# Patient Record
Sex: Female | Born: 1975 | Race: White | Hispanic: No | Marital: Married | Smoking: Former smoker
Health system: Southern US, Community
[De-identification: ages and names within clinical notes are randomized; demographics above are authoritative.]

## PROBLEM LIST (undated history)

## (undated) DIAGNOSIS — I10 Essential (primary) hypertension: Secondary | ICD-10-CM

## (undated) DIAGNOSIS — R87619 Unspecified abnormal cytological findings in specimens from cervix uteri: Secondary | ICD-10-CM

## (undated) DIAGNOSIS — R87629 Unspecified abnormal cytological findings in specimens from vagina: Secondary | ICD-10-CM

## (undated) DIAGNOSIS — IMO0002 Reserved for concepts with insufficient information to code with codable children: Secondary | ICD-10-CM

## (undated) HISTORY — PX: LEEP: SHX91

## (undated) HISTORY — DX: Unspecified abnormal cytological findings in specimens from vagina: R87.629

## (undated) HISTORY — DX: Reserved for concepts with insufficient information to code with codable children: IMO0002

## (undated) HISTORY — PX: BREAST BIOPSY: SHX20

## (undated) HISTORY — DX: Essential (primary) hypertension: I10

## (undated) HISTORY — DX: Unspecified abnormal cytological findings in specimens from cervix uteri: R87.619

---

## 2002-03-19 ENCOUNTER — Other Ambulatory Visit: Admission: RE | Admit: 2002-03-19 | Discharge: 2002-03-19 | Payer: Self-pay | Admitting: *Deleted

## 2004-09-08 ENCOUNTER — Emergency Department (HOSPITAL_COMMUNITY): Admission: EM | Admit: 2004-09-08 | Discharge: 2004-09-09 | Payer: Self-pay | Admitting: *Deleted

## 2004-09-09 ENCOUNTER — Ambulatory Visit (HOSPITAL_COMMUNITY): Admission: RE | Admit: 2004-09-09 | Discharge: 2004-09-09 | Payer: Self-pay | Admitting: *Deleted

## 2005-01-27 ENCOUNTER — Inpatient Hospital Stay (HOSPITAL_COMMUNITY): Admission: RE | Admit: 2005-01-27 | Discharge: 2005-01-30 | Payer: Self-pay | Admitting: Obstetrics and Gynecology

## 2006-09-01 IMAGING — US US ABDOMEN COMPLETE
1 series · 14 of 25 positions shown · non-contrast
Comparison: none

CLINICAL DATA: Upper abdominal pain and vomiting.  18 weeks pregnant.
 US ABDOMEN COMPLETE:
 Multiple real-time sector scans were performed.  
 The liver has a normal appearance without focal lesions or biliary ductal dilatation.  The gallbladder is normal without stones, sludge, or wall thickening.  The common duct is normal at 3 mm.  The aorta and IVC are normal.  The spleen and pancreas are normal.  Both kidneys are normal, measuring 10.2 and 10.3 cm in length.  No free fluid.  The fetus is alive.

[Series 1: unknown · 0.33mm/px · 14 of 63 slices shown]
[im 1/63]
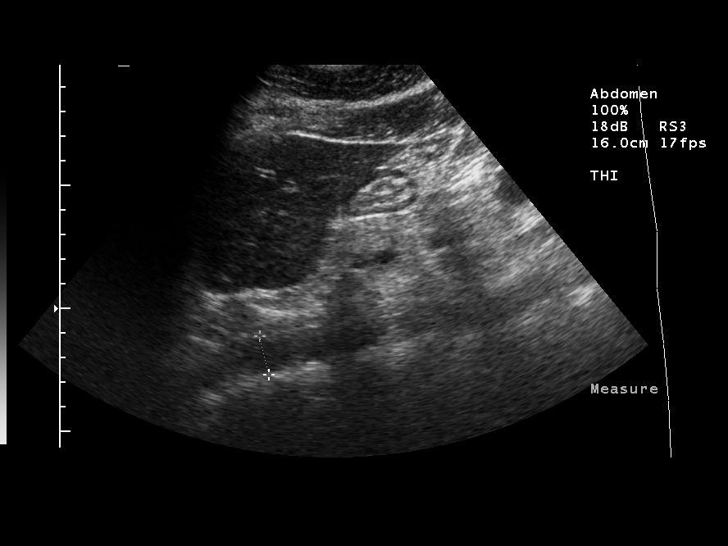
[im 6/63]
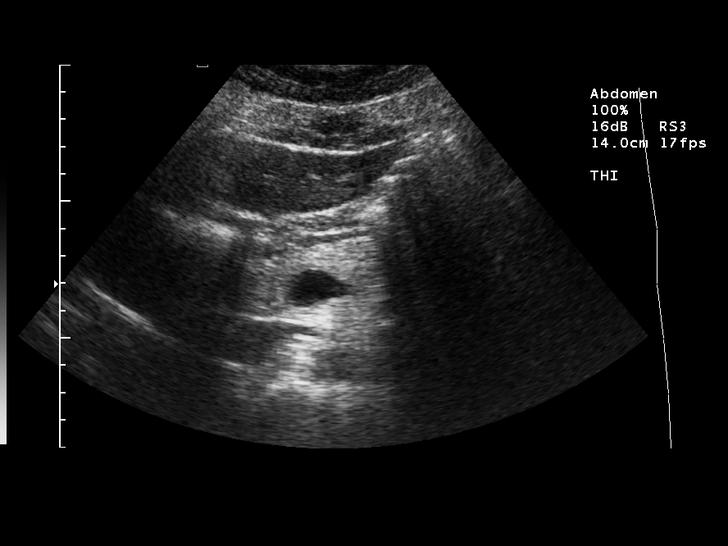
[im 11/63]
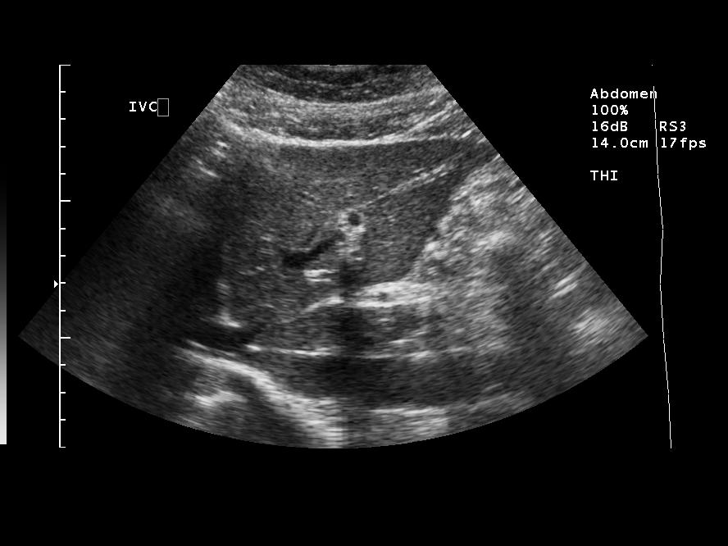
[im 16/63]
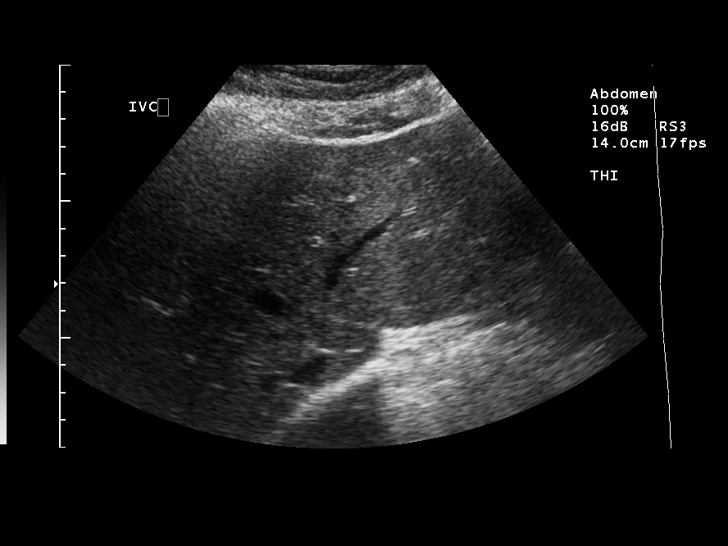
[im 21/63]
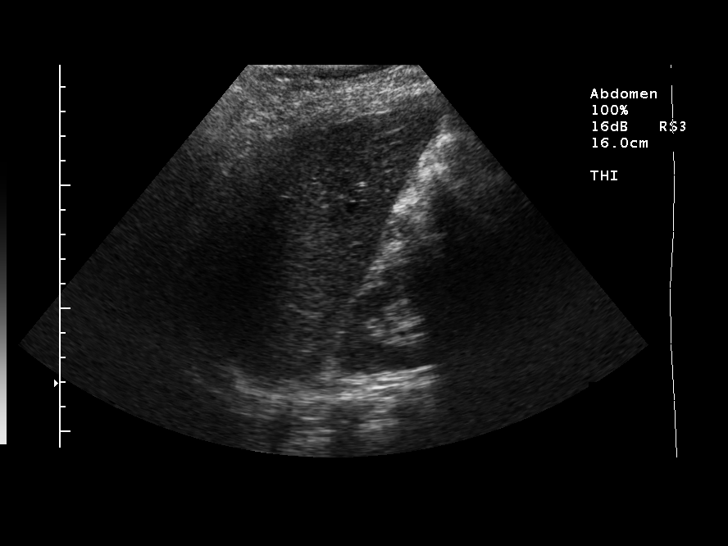
[im 24/63]
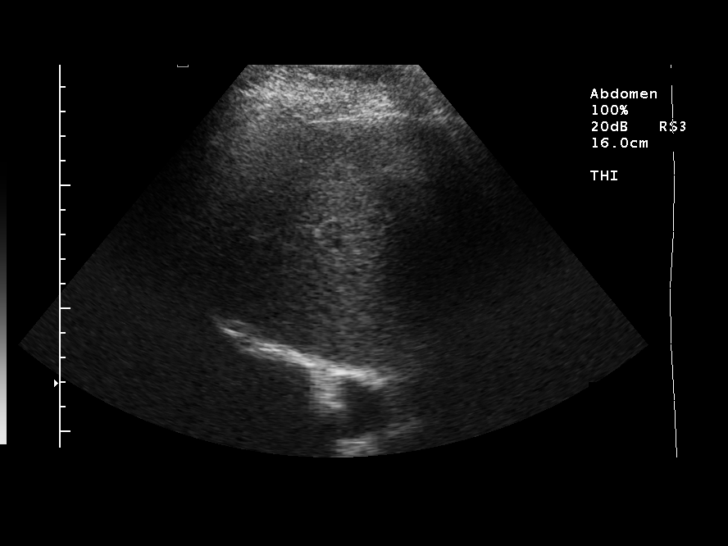
[im 29/63]
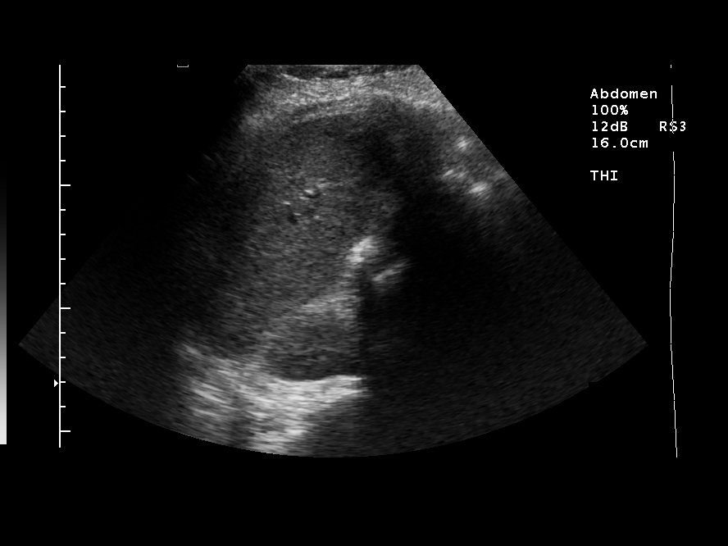
[im 34/63]
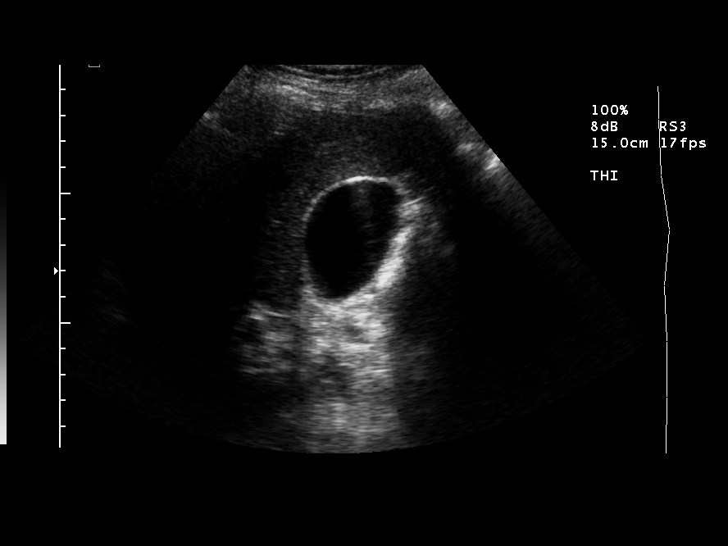
[im 39/63]
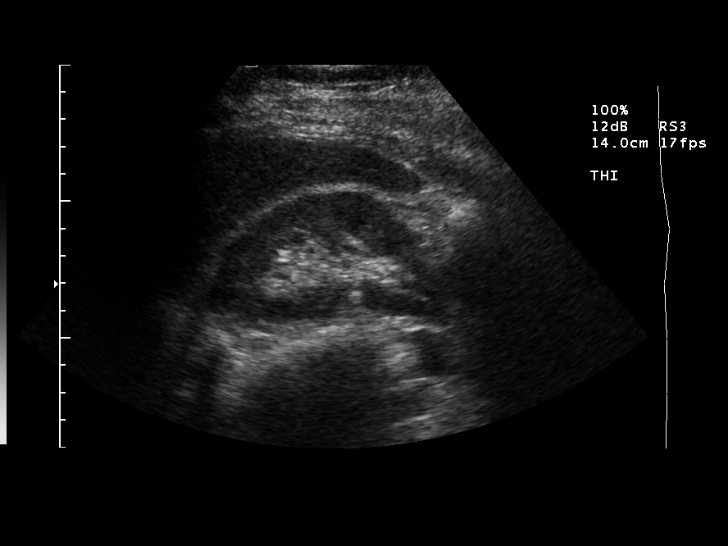
[im 42/63]
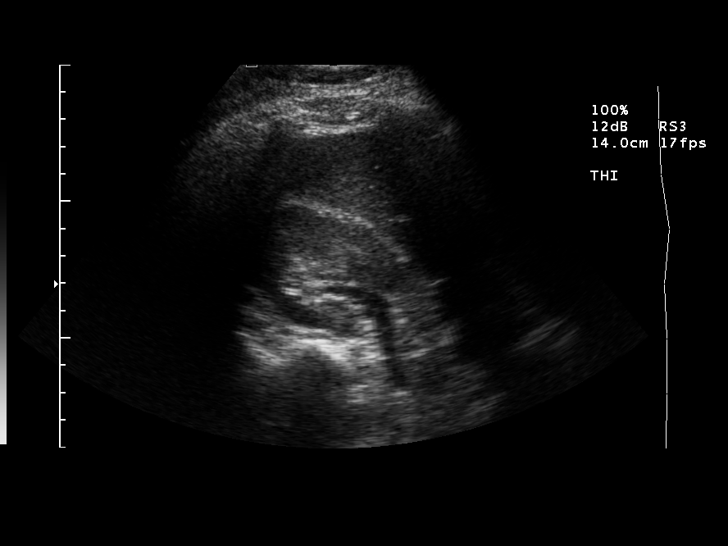
[im 47/63]
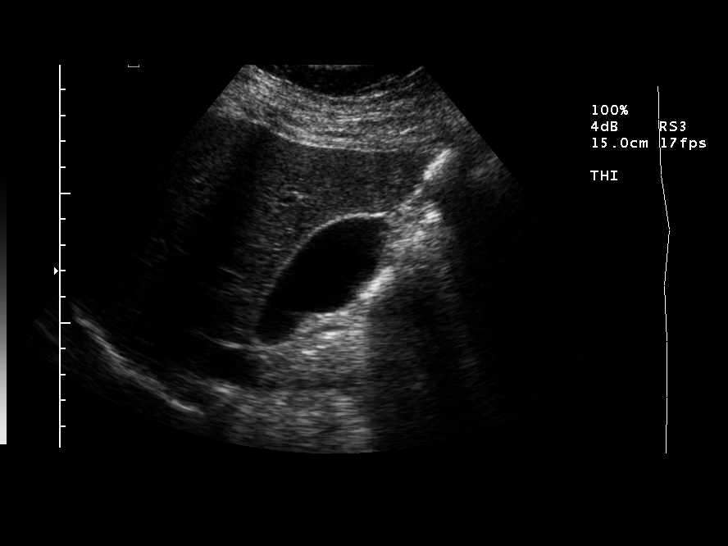
[im 52/63]
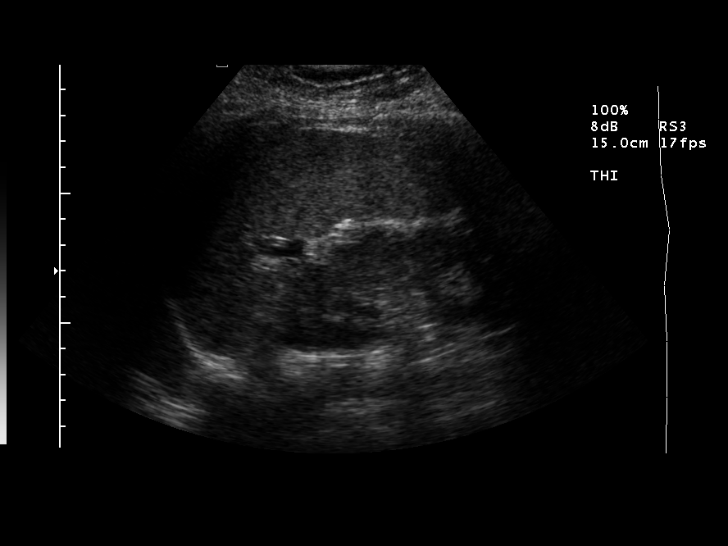
[im 57/63]
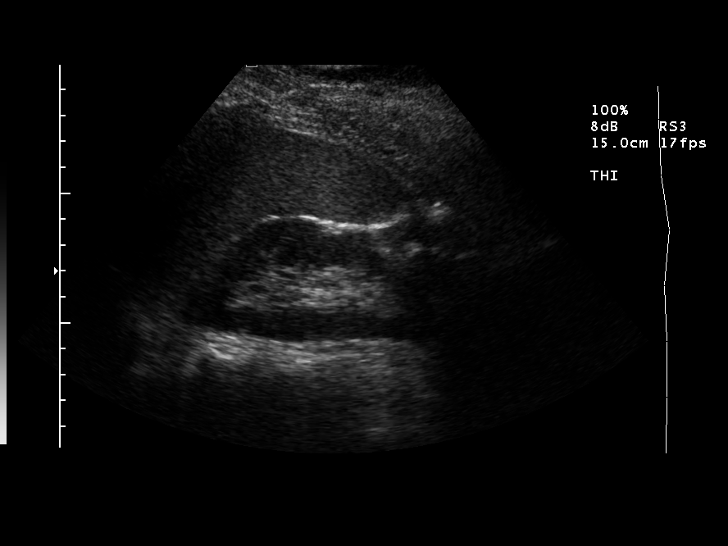
[im 63/63]
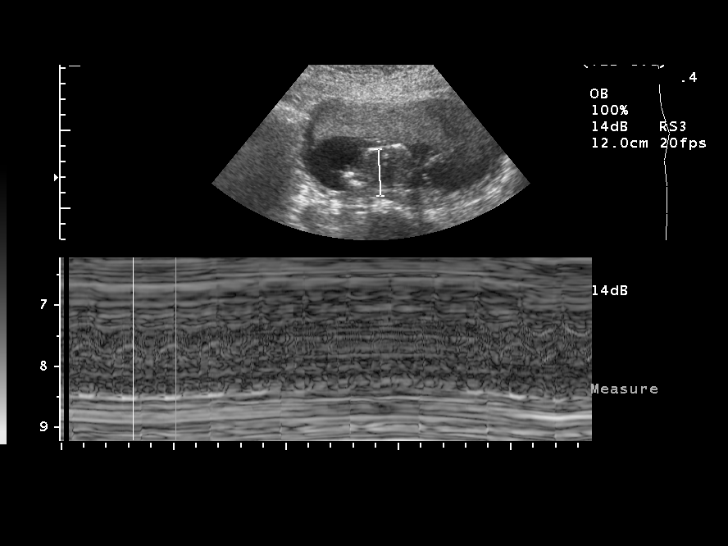

[14 of 25 positions shown; findings below may reference images not displayed]

IMPRESSION: Normal abdominal ultrasound.

## 2007-07-31 ENCOUNTER — Other Ambulatory Visit: Admission: RE | Admit: 2007-07-31 | Discharge: 2007-07-31 | Payer: Self-pay | Admitting: Obstetrics and Gynecology

## 2008-08-05 ENCOUNTER — Other Ambulatory Visit: Admission: RE | Admit: 2008-08-05 | Discharge: 2008-08-05 | Payer: Self-pay | Admitting: Obstetrics and Gynecology

## 2009-10-21 ENCOUNTER — Other Ambulatory Visit: Admission: RE | Admit: 2009-10-21 | Discharge: 2009-10-21 | Payer: Self-pay | Admitting: Obstetrics and Gynecology

## 2010-11-17 ENCOUNTER — Other Ambulatory Visit (HOSPITAL_COMMUNITY)
Admission: RE | Admit: 2010-11-17 | Discharge: 2010-11-17 | Disposition: A | Discharge status: Home / Self Care | Payer: PRIVATE HEALTH INSURANCE | Source: Ambulatory Visit | Attending: Obstetrics and Gynecology | Admitting: Obstetrics and Gynecology

## 2010-11-17 ENCOUNTER — Other Ambulatory Visit: Payer: Self-pay | Admitting: Obstetrics and Gynecology

## 2010-11-17 DIAGNOSIS — Z01419 Encounter for gynecological examination (general) (routine) without abnormal findings: Secondary | ICD-10-CM | POA: Insufficient documentation

## 2010-12-17 NOTE — Op Note (Signed)
NAMEMarland Kitchen  Jasmine Weeks, Jasmine Weeks NO.:  [id number]   MEDICAL RECORD NO.:  [id number]          PATIENT TYPE:  INP   LOCATION:  LDR4                          FACILITY:  [facility]   PHYSICIAN:  Tilda Burrow, M.D. DATE OF BIRTH:  Nov 08, 1975   DATE OF PROCEDURE:  01/27/2005  DATE OF DISCHARGE:                                 OPERATIVE REPORT   DELIVERY NOTE:  [name] was admitted on the morning of January 27, 2005  with the cervix 1, 50% and -3 station with the presenting part quite high.  Membrane rupture was confirmed.  She was begun on Pitocin and had a slow  response.  A 4 P.M. she was only 1 cm, 50% and -3 with a narrow pelvis.  Contractions were mild on oxytocin 20 mg/minute.  Fetal heart rate was  reactive.  She progressed minimally by 7 P.M. and a pressure catheter was  inserted with the cervix 2 cm.  She progressed to a rapid active phase of  labor, delivering at 11:30 P.M., after two contractions in the second stage,  a healthy female infant with Apgars of 8 and 9, weight __________  without  lacerations.  There was a nuchal cord times one.   The amniotic fluid was clear without malodor.   Delivery was attended by  the baby's father.   There was [facility] presentation of the placenta.   ESTIMATED BLOOD LOSS:  Three-hundred-fifty milliliter blood loss.       JVF/MEDQ  D:  01/27/2005  T:  01/28/2005  Job:  [id number]

## 2010-12-17 NOTE — H&P (Signed)
NAME:  Jasmine Weeks, Jasmine Weeks NO.:  [id number]   MEDICAL RECORD NO.:  [id number]          PATIENT TYPE:  INP   LOCATION:  LDR4                          FACILITY:  [facility]   PHYSICIAN:  Tilda Burrow, M.D. DATE OF BIRTH:  30-Oct-1975   DATE OF ADMISSION:  01/27/2005  DATE OF DISCHARGE:  LH                                HISTORY & PHYSICAL   REASON FOR ADMISSION:  Pregnancy at 33 weeks with spontaneous rupture of  membranes at 3:30 a.m.   HISTORY OF PRESENT ILLNESS:  Analysia is a 35 year old, gravida 2, para 1, EDC  July 19 who woke up this morning leaking clear fluid but is now grossly  ruptured.   PAST MEDICAL HISTORY:  Negative.   PAST SURGICAL HISTORY:  Negative.   ALLERGIES:  She is allergic to PENICILLIN.   FAMILY HISTORY:  Positive for diabetes, hypertension, coronary artery  disease and cancer.   SOCIAL HISTORY:  She is married and her husband is here with her.   PRENATAL COURSE:  Essentially uneventful. Blood type is O negative. Rubella  is immune. Hepatitis C surface antigen negative, HIV is negative, HSV 2 is  negative. __________ is nonreactive. Pap is normal. GC and chlamydia are  both negative, AFP normal. A 28 week hemoglobin 11.3, 28 week hematocrit  34.5. One hour glucose was 176. Three hour glucose was as follows, normal.  Sickle cell screen was negative, GBS is positive and she is to be treated  with prophylaxis.   PHYSICAL EXAMINATION:  Stable. Fetal heart rate pattern is stable. She is  leaking a large amount of clear fluid.   PLAN:  Going to admit. Expect vaginal delivery. Start some Pitocin  augmentation.      DL/MEDQ  D:  81/19/1478  T:  01/27/2005  Job:  [id number]

## 2011-12-07 ENCOUNTER — Other Ambulatory Visit (HOSPITAL_COMMUNITY)
Admission: RE | Admit: 2011-12-07 | Discharge: 2011-12-07 | Disposition: A | Discharge status: Home / Self Care | Payer: PRIVATE HEALTH INSURANCE | Source: Ambulatory Visit | Attending: Obstetrics and Gynecology | Admitting: Obstetrics and Gynecology

## 2011-12-07 ENCOUNTER — Other Ambulatory Visit: Payer: Self-pay | Admitting: Obstetrics and Gynecology

## 2011-12-07 DIAGNOSIS — Z01419 Encounter for gynecological examination (general) (routine) without abnormal findings: Secondary | ICD-10-CM | POA: Insufficient documentation

## 2013-01-17 ENCOUNTER — Other Ambulatory Visit: Payer: Self-pay | Admitting: Obstetrics and Gynecology

## 2013-01-21 ENCOUNTER — Encounter: Payer: Self-pay | Admitting: *Deleted

## 2013-01-21 ENCOUNTER — Other Ambulatory Visit: Payer: PRIVATE HEALTH INSURANCE

## 2013-01-21 ENCOUNTER — Encounter: Payer: Self-pay | Admitting: Obstetrics and Gynecology

## 2013-01-21 ENCOUNTER — Ambulatory Visit (INDEPENDENT_AMBULATORY_CARE_PROVIDER_SITE_OTHER): Payer: PRIVATE HEALTH INSURANCE | Admitting: Obstetrics and Gynecology

## 2013-01-21 ENCOUNTER — Other Ambulatory Visit (HOSPITAL_COMMUNITY)
Admission: RE | Admit: 2013-01-21 | Discharge: 2013-01-21 | Disposition: A | Discharge status: Home / Self Care | Payer: PRIVATE HEALTH INSURANCE | Source: Ambulatory Visit | Attending: Obstetrics and Gynecology | Admitting: Obstetrics and Gynecology

## 2013-01-21 VITALS — BP 120/84 | Ht 63.75 in | Wt 175.6 lb

## 2013-01-21 DIAGNOSIS — Z0000 Encounter for general adult medical examination without abnormal findings: Secondary | ICD-10-CM

## 2013-01-21 DIAGNOSIS — Z01419 Encounter for gynecological examination (general) (routine) without abnormal findings: Secondary | ICD-10-CM

## 2013-01-21 DIAGNOSIS — Z1151 Encounter for screening for human papillomavirus (HPV): Secondary | ICD-10-CM | POA: Insufficient documentation

## 2013-01-21 LAB — CBC
HCT: 39.4 % (ref 36.0–46.0)
MCHC: 34.8 g/dL (ref 30.0–36.0)
RDW: 14.1 % (ref 11.5–15.5)

## 2013-01-21 LAB — LIPID PANEL
LDL Cholesterol: 84 mg/dL (ref 0–99)
Triglycerides: 158 mg/dL — ABNORMAL HIGH (ref <150)
VLDL: 32 mg/dL (ref 0–40)

## 2013-01-21 LAB — COMPREHENSIVE METABOLIC PANEL
AST: 13 U/L (ref 0–37)
Albumin: 4.3 g/dL (ref 3.5–5.2)
Alkaline Phosphatase: 40 U/L (ref 39–117)
BUN: 13 mg/dL (ref 6–23)
Potassium: 4 mEq/L (ref 3.5–5.3)
Sodium: 140 mEq/L (ref 135–145)

## 2013-01-21 NOTE — H&P (Addendum)
   [facility] Visit  Patient name: Jasmine Weeks MRN [id number]  Date of birth: 04/20/1976  CC & HPI:  Jasmine Weeks is a 38 y.o. female presenting today for annual exam.  ROS:  No gyn complaints. Desires annual lab work. Is n.p.o. today has no primary care physician that she sees regularly. Sexually active without current contraception none contraception desired Pertinent History Reviewed:  Medical & Surgical Hx:  Reviewed: Significant for negative review of systems Medications: Reviewed & Updated - see associated section Social History: Reviewed -  reports that she has been smoking Cigarettes.  She has been smoking about 0.50 packs per day. She has never used smokeless tobacco.  Objective Findings:  Vitals: BP 120/84  Ht 5' 3.75" (1.619 m)  Wt 175 lb 9.6 oz (79.652 kg)  BMI 30.39 kg/m2  LMP 01/07/2013  Physical Examination: General appearance - alert, well appearing, and in no distress, oriented to person, place, and time and normal appearing weight Mental status - alert, oriented to person, place, and time, normal mood, behavior, speech, dress, motor activity, and thought processes Eyes - pupils equal and reactive, extraocular eye movements intact Lymphatics -  Heart - normal rate and regular rhythm Abdomen - soft, nontender, nondistended, no masses or organomegaly Breasts - breasts appear normal, no suspicious masses, no skin or nipple changes or axillary nodes Pelvic - normal external genitalia, vulva, vagina, cervix, uterus and adnexa. Patient status post LEEP but cervix appears grossly normal Extremities - peripheral pulses normal, no pedal edema, no clubbing or cyanosis, Homan's sign negative bilaterally Skin - normal coloration and turgor, no rashes, no suspicious skin lesions noted    Assessment & Plan:   Normal annual GYN exam Plan: Screening laboratory work including CBC, CMP, thyroid function tests, lipids     Prenatal vitamins encouraged Seen by Dr.  Margot Ables under my direct supervision and results confirmed

## 2013-01-21 NOTE — Progress Notes (Signed)
Tilda Burrow, MD Physician Addendum  H&P Service date: 01/21/2013 10:32 AM   [facility] Visit   Patient name: Jasmine Weeks MRN [id number] Date of birth: [birth year]   CC & HPI:   Jasmine Weeks is a 37 y.o. female presenting today for annual exam.   ROS:   No gyn complaints. Desires annual lab work. Is n.p.o. today has no primary care physician that she sees regularly.  Sexually active without current contraception none contraception desired   Pertinent History Reviewed:   Medical & Surgical Hx: Reviewed: Significant for negative review of systems  Medications: Reviewed & Updated - see associated section  Social History: Reviewed - reports that she has been smoking Cigarettes. She has been smoking about 0.50 packs per day. She has never used smokeless tobacco.   Objective Findings:   Vitals: BP 120/84  Ht 5' 3.75" (1.619 m)  Wt 175 lb 9.6 oz (79.652 kg)  BMI 30.39 kg/m2  LMP 01/07/2013  Physical Examination: General appearance - alert, well appearing, and in no distress, oriented to person, place, and time and normal appearing weight  Mental status - alert, oriented to person, place, and time, normal mood, behavior, speech, dress, motor activity, and thought processes  Eyes - pupils equal and reactive, extraocular eye movements intact  Lymphatics -  Heart - normal rate and regular rhythm  Abdomen - soft, nontender, nondistended, no masses or organomegaly  Breasts - breasts appear normal, no suspicious masses, no skin or nipple changes or axillary nodes  Pelvic - normal external genitalia, vulva, vagina, cervix, uterus and adnexa. Patient status post LEEP but cervix appears grossly normal  Extremities - peripheral pulses normal, no pedal edema, no clubbing or cyanosis, Homan's sign negative bilaterally  Skin - normal coloration and turgor, no rashes, no suspicious skin lesions noted   Assessment & Plan:   Normal annual GYN exam  Plan: Screening laboratory work including  CBC, CMP, thyroid function tests, lipids  Prenatal vitamins encouraged  Seen by Dr. Margot Ables under my direct supervision and results confirmed

## 2013-01-21 NOTE — Patient Instructions (Addendum)
Thank you for coming in today Overall you are doing great. We will let you know if any of your labs come back abnormal Please start taking a prenatal vitamin if you anticipate getting pregnant Please come back in 1 year for your next physical or sooner if needed Have a great day.  Pap Test A Pap test is a procedure done in a clinic office to evaluate cells that are on the surface of the cervix. The cervix is the lower portion of the uterus and upper portion of the vagina. For some women, the cervical region has the potential to form cancer. With consistent evaluations by your caregiver, this type of cancer can be prevented.  If a Pap test is abnormal, it is most often a result of a previous exposure to human papillomavirus (HPV). HPV is a virus that can infect the cells of the cervix and cause dysplasia. Dysplasia is where the cells no longer look normal. If a woman has been diagnosed with high-grade or severe dysplasia, they are at higher risk of developing cervical cancer. People diagnosed with low-grade dysplasia should still be seen by their caregiver because there is a small chance that low-grade dysplasia could develop into cancer.  LET YOUR CAREGIVER KNOW ABOUT:  Recent sexually transmitted infection (STI) you have had.  Any new sex partners you have had.  History of previous abnormal Pap tests results.  History of previous cervical procedures you have had (colposcopy, biopsy, loop electrosurgical excision procedure [LEEP]).  Concerns you have had regarding unusual vaginal discharge.  History of pelvic pain.  Your use of birth control. BEFORE THE PROCEDURE  Ask your caregiver when to schedule your Pap test. It is best not to be on your period if your caregiver uses a wooden spatula to collect cells or applies cells to a glass slide. Newer techniques are not so sensitive to the timing of a menstrual cycle.  Do not douche or have sexual intercourse for 24 hours before the test.    Do not use vaginal creams or tampons for 24 hours before the test.   Empty your bladder just before the test to lessen any discomfort.  PROCEDURE You will lie on an exam table with your feet in stirrups. A warm metal or plastic instrument (speculum) is placed in your vagina. This instrument allows your caregiver to see the inside of your vagina and look at your cervix. A small, plastic brush or wooden spatula is then used to collect cervical cells. These cells are placed in a lab specimen container. The cells are looked at under a microscope. A specialist will determine if the cells are normal.  AFTER THE PROCEDURE Make sure to get your test results.If your results come back abnormal, you may need further testing.  Document Released: 10/08/2002 Document Revised: 10/10/2011 Document Reviewed: 07/14/2011 [facility] Patient Information 2014 [place], [place].

## 2013-01-27 ENCOUNTER — Telehealth: Payer: Self-pay | Admitting: Obstetrics and Gynecology

## 2013-01-27 NOTE — Telephone Encounter (Signed)
Patient made aware of normal saline lab results including Pap smear thyroid function tests and lab work

## 2014-02-26 ENCOUNTER — Encounter: Payer: Self-pay | Admitting: Obstetrics and Gynecology

## 2014-02-26 ENCOUNTER — Other Ambulatory Visit (HOSPITAL_COMMUNITY)
Admission: RE | Admit: 2014-02-26 | Discharge: 2014-02-26 | Disposition: A | Discharge status: Home / Self Care | Payer: PRIVATE HEALTH INSURANCE | Source: Ambulatory Visit | Attending: Obstetrics and Gynecology | Admitting: Obstetrics and Gynecology

## 2014-02-26 ENCOUNTER — Ambulatory Visit (INDEPENDENT_AMBULATORY_CARE_PROVIDER_SITE_OTHER): Payer: PRIVATE HEALTH INSURANCE | Admitting: Obstetrics and Gynecology

## 2014-02-26 VITALS — BP 124/80 | Ht 63.0 in | Wt 163.0 lb

## 2014-02-26 DIAGNOSIS — R87619 Unspecified abnormal cytological findings in specimens from cervix uteri: Secondary | ICD-10-CM

## 2014-02-26 DIAGNOSIS — Z01419 Encounter for gynecological examination (general) (routine) without abnormal findings: Secondary | ICD-10-CM

## 2014-02-26 DIAGNOSIS — Z1151 Encounter for screening for human papillomavirus (HPV): Secondary | ICD-10-CM | POA: Insufficient documentation

## 2014-02-26 NOTE — Progress Notes (Signed)
Patient ID: Jasmine FormLori Weeks, female   DOB: 09/14/1975, 38 y.o.   MRN: [id number] This chart was scribed by Leone PayorSonum Patel, Medical Scribe, for Dr. Christin BachJohn Earsie Humm on 02/26/14 at 11:17 AM. This chart was reviewed by Dr. Christin BachJohn Jonah Nestle for accuracy.   Subjective:  Jasmine Weeks is a 38 y.o. female G2P2 who presents for annual exam. Patient's last menstrual period was 01/23/2014. Pt here today for annual exam. Pt states that she has few moles/skin tags she wants Dr. Emelda FearFerguson to look at today as well. Pt denies any other problems or concerns at this time.   The following portions of the patient's history were reviewed and updated as appropriate: allergies, current medications, past family history, past medical history, past social history, past surgical history and problem list. Past Medical History  Diagnosis Date  . Abnormal Pap smear     Past Surgical History  Procedure Laterality Date  . Leep      No current outpatient prescriptions on file.  Review of Systems Constitutional: negative Gastrointestinal: negative Genitourinary: negative  Objective:  BP 124/80  Ht 5\' 3"  (1.6 m)  Wt 163 lb (73.936 kg)  BMI 28.88 kg/m2  LMP 01/23/2014   BMI: Body mass index is 28.88 kg/(m^2).  General Appearance: Alert, appropriate appearance for age. No acute distress HEENT: Grossly normal benign appearing smooth 3 mm mole with hair x 1 on right jaw line,  Neck / Thyroid:  Cardiovascular: RRR; normal S1, S2, no murmur Lungs: CTA bilaterally   Skin tag on right chest wall 3mm x 6 mm long, will excise herself Back: No CVAT Breast Exam: No dimpling, nipple retraction or discharge. No masses or nodes., Normal to inspection, Normal breast tissue bilaterally and No masses or nodes.No dimpling, nipple retraction or discharge. Gastrointestinal: Soft, non-tender, no masses or organomegaly Pelvic Exam: External genitalia: normal general appearance Vaginal: normal mucosa without prolapse or lesions, normal without  tenderness, induration or masses and normal rugae Cervix: normal appearance Adnexa: normal bimanual exam Uterus: normal single, nontender Rectovaginal: not indicated Lymphatic Exam: Non-palpable nodes in neck, clavicular, axillary, or inguinal regions  Skin: no rash or abnormalities Neurologic: Normal gait and speech, no tremor  Psychiatric: Alert and oriented, appropriate affect.  Urinalysis:Not done  Assessment:  Annual Gyn Exam Benign appearing skin tag chest wall Hx LEEP for abnormal pap   Plan:  1. pap smear done, next pap due 1 year annual x 20 yr due to LEEp 2. return annually or prn 3    mammogram advised at age 38  Christin BachJohn Yachet Mattson. MD Pgr [phone] 11:15 AM

## 2014-02-27 LAB — CYTOLOGY - PAP

## 2014-06-02 ENCOUNTER — Encounter: Payer: Self-pay | Admitting: Obstetrics and Gynecology

## 2015-05-11 ENCOUNTER — Ambulatory Visit (INDEPENDENT_AMBULATORY_CARE_PROVIDER_SITE_OTHER): Payer: PRIVATE HEALTH INSURANCE | Admitting: Obstetrics and Gynecology

## 2015-05-11 ENCOUNTER — Other Ambulatory Visit (HOSPITAL_COMMUNITY)
Admission: RE | Admit: 2015-05-11 | Discharge: 2015-05-11 | Disposition: A | Discharge status: Home / Self Care | Payer: PRIVATE HEALTH INSURANCE | Source: Ambulatory Visit | Attending: Obstetrics and Gynecology | Admitting: Obstetrics and Gynecology

## 2015-05-11 ENCOUNTER — Encounter: Payer: Self-pay | Admitting: Obstetrics and Gynecology

## 2015-05-11 VITALS — BP 130/88 | Ht 63.0 in

## 2015-05-11 DIAGNOSIS — Z01419 Encounter for gynecological examination (general) (routine) without abnormal findings: Secondary | ICD-10-CM

## 2015-05-11 DIAGNOSIS — Z1151 Encounter for screening for human papillomavirus (HPV): Secondary | ICD-10-CM | POA: Diagnosis present

## 2015-05-11 DIAGNOSIS — Z01411 Encounter for gynecological examination (general) (routine) with abnormal findings: Secondary | ICD-10-CM | POA: Diagnosis present

## 2015-05-11 NOTE — Progress Notes (Signed)
Patient ID: Jasmine Weeks, female   DOB: 14-Aug-1975, 39 y.o.   MRN: [id number]  Assessment:  Annual Gyn Exam   Plan:  1. pap smear done, next pap due next year (annual due to previous abnormal pap) 2. return annually or prn 3    Annual mammogram advised Subjective:  Jasmine Weeks is a 39 y.o. female G2P2 who presents for annual exam. Patient's last menstrual period was 04/30/2015.   The patient has no complaints today. Pt has had previous abnormal paps. Her last pap was 02/26/2014, which was normal and negative for HPV. Pt is not currently on birth control, but states her partner had a vasectomy. Her LMP started last week. Her menses are mostly regular.    The following portions of the patient's history were reviewed and updated as appropriate: allergies, current medications, past family history, past medical history, past social history, past surgical history and problem list. Past Medical History  Diagnosis Date  . Abnormal Pap smear     Past Surgical History  Procedure Laterality Date  . Leep      No current outpatient prescriptions on file.  Review of Systems Constitutional: negative Gastrointestinal: negative Genitourinary: negative  Objective:  BP 130/88 mmHg  Ht  (1.6 m)  LMP 04/30/2015   BMI: There is no weight on file to calculate BMI.  General Appearance: Alert, appropriate appearance for age. No acute distress HEENT: Grossly normal Neck / Thyroid:  Cardiovascular: RRR; normal S1, S2, no murmur Lungs: CTA bilaterally Back: No CVAT Breast Exam: Normal to inspection, Normal breast tissue bilaterally and No masses or nodes.No dimpling, nipple retraction or discharge. Gastrointestinal: Soft, non-tender, no masses or organomegaly Pelvic Exam: Vulva and vagina appear normal. Bimanual exam reveals normal uterus and adnexa. External genitalia: normal general appearance and 2 Nabothian cysts Vaginal: normal mucosa without prolapse or lesions and normal without  tenderness, induration or masses Cervix: normal appearance Adnexa: normal bimanual exam Uterus: normal single, nontender Rectovaginal: normal rectal, no masses Lymphatic Exam: Non-palpable nodes in neck, clavicular, axillary, or inguinal regions  Skin: no rash or abnormalities Neurologic: Normal gait and speech, no tremor  Psychiatric: Alert and oriented, appropriate affect.  Urinalysis:Not done  Christin Bach. MD Pgr [phone] 10:55 AM    By signing my name below, I, Gwenyth Ober, attest that this documentation has been prepared under the direction and in the presence of Christin Bach, MD. Electronically Signed: Gwenyth Ober, ED Scribe. 05/11/2015. 10:56 AM.  I personally performed the services described in this documentation, which was SCRIBED in my presence. The recorded information has been reviewed and considered accurate. It has been edited as necessary during review. Tilda Burrow, MD

## 2015-05-11 NOTE — Progress Notes (Signed)
Patient ID: Jasmine Weeks, female   DOB: October 09, 1975, 39 y.o.   MRN: [id number] Pt here today for annual exam. Pt had normal pap with - HPV 02/26/14. Pt denies any problems or concerns at this time.

## 2015-05-12 LAB — CYTOLOGY - PAP

## 2016-05-16 ENCOUNTER — Ambulatory Visit (INDEPENDENT_AMBULATORY_CARE_PROVIDER_SITE_OTHER): Payer: PRIVATE HEALTH INSURANCE | Admitting: Obstetrics and Gynecology

## 2016-05-16 ENCOUNTER — Other Ambulatory Visit (HOSPITAL_COMMUNITY)
Admission: RE | Admit: 2016-05-16 | Discharge: 2016-05-16 | Disposition: A | Discharge status: Home / Self Care | Payer: PRIVATE HEALTH INSURANCE | Source: Ambulatory Visit | Attending: Obstetrics and Gynecology | Admitting: Obstetrics and Gynecology

## 2016-05-16 ENCOUNTER — Encounter: Payer: Self-pay | Admitting: Obstetrics and Gynecology

## 2016-05-16 VITALS — BP 130/80 | HR 90 | Ht 61.25 in | Wt 139.6 lb

## 2016-05-16 DIAGNOSIS — Z01419 Encounter for gynecological examination (general) (routine) without abnormal findings: Secondary | ICD-10-CM | POA: Diagnosis not present

## 2016-05-16 DIAGNOSIS — Z1151 Encounter for screening for human papillomavirus (HPV): Secondary | ICD-10-CM | POA: Insufficient documentation

## 2016-05-16 DIAGNOSIS — R87612 Low grade squamous intraepithelial lesion on cytologic smear of cervix (LGSIL): Secondary | ICD-10-CM

## 2016-05-16 DIAGNOSIS — Z01411 Encounter for gynecological examination (general) (routine) with abnormal findings: Secondary | ICD-10-CM | POA: Insufficient documentation

## 2016-05-16 NOTE — Addendum Note (Signed)
Addended by: Criss AlvinePULLIAM, CHRYSTAL G on: 05/16/2016 10:59 AM   Modules accepted: Orders

## 2016-05-16 NOTE — Progress Notes (Signed)
  Assessment:  Annual Gyn Exam  hx LEEP for abnormal pap age 40 Plan:  1. pap smear done, next pap due 1 year 2.  3. return annually or prn Subjective:  Jasmine Weeks is a 40 y.o. female G2P2 who presents for annual exam. Patient's last menstrual period was 04/25/2016. The patient has no complaints today.  The following portions of the patient's history were reviewed and updated as appropriate: allergies, current medications, past family history, past medical history, past social history, past surgical history and problem list. Past Medical History:  Diagnosis Date  . Abnormal Pap smear     Past Surgical History:  Procedure Laterality Date  . LEEP       Current Outpatient Prescriptions:  .  phentermine 37.5 MG capsule, Take 37.5 mg by mouth every morning., Disp: , Rfl:   Review of Systems Constitutional: negative Gastrointestinal: negative Genitourinary: negative   Objective:  BP 130/80   Pulse 90   Ht 5' 1.25" (1.556 m)   Wt 139 lb 9.6 oz (63.3 kg)   LMP 04/25/2016   BMI 26.16 kg/m    BMI: Body mass index is 26.16 kg/m.  General Appearance: Alert, appropriate appearance for age. No acute distress HEENT: Grossly normal Neck / Thyroid:  Cardiovascular: RRR; normal S1, S2, no murmur Lungs: CTA bilaterally Back: No CVAT Breast Exam: No dimpling, nipple retraction or discharge. No masses or nodes., Normal to inspection, Normal breast tissue bilaterally and No masses or nodes.No dimpling, nipple retraction or discharge. Gastrointestinal: Soft, non-tender, no masses or organomegaly Pelvic Exam: External genitalia: normal general appearance Vaginal: normal mucosa without prolapse or lesions and normal without tenderness, induration or masses Cervix: well healed status post LEEP Adnexa: normal bimanual exam Uterus: normal single, nontender Rectovaginal: not indicated Lymphatic Exam: Non-palpable nodes in neck, clavicular, axillary, or inguinal regions  Skin: no rash or  abnormalities Neurologic: Normal gait and speech, no tremor  Psychiatric: Alert and oriented, appropriate affect.   Urinalysis:Not done  Christin BachJohn Beadie Matsunaga. MD Pgr [phone] 10:17 AM   By signing my name below, I, Sonum Patel, attest that this documentation has been prepared under the direction and in the presence of Tilda BurrowJohn V Landen Breeland, MD. Electronically Signed: Sonum Patel, Neurosurgeoncribe. 05/16/16. 10:17 AM.  .I personally performed the services described in this documentation, which was SCRIBED in my presence. The recorded information has been reviewed and considered accurate. It has been edited as necessary during review. Tilda BurrowFERGUSON,Darrien Belter V, MD

## 2016-05-18 LAB — CYTOLOGY - PAP
Diagnosis: NEGATIVE
HPV (WINDOPATH): NOT DETECTED

## 2017-07-17 ENCOUNTER — Other Ambulatory Visit: Payer: PRIVATE HEALTH INSURANCE | Admitting: Obstetrics and Gynecology

## 2017-08-09 ENCOUNTER — Ambulatory Visit (INDEPENDENT_AMBULATORY_CARE_PROVIDER_SITE_OTHER): Payer: PRIVATE HEALTH INSURANCE | Admitting: Obstetrics and Gynecology

## 2017-08-09 ENCOUNTER — Encounter: Payer: Self-pay | Admitting: Obstetrics and Gynecology

## 2017-08-09 VITALS — BP 102/60 | HR 84 | Ht 63.0 in | Wt 147.8 lb

## 2017-08-09 DIAGNOSIS — Z01419 Encounter for gynecological examination (general) (routine) without abnormal findings: Secondary | ICD-10-CM

## 2017-08-09 NOTE — Progress Notes (Addendum)
Patient ID: Jasmine Weeks, female   DOB: 05/07/1976, 42 y.o.   MRN: [id number]  Assessment:  1. Annual Gyn Exam 2. Last normal pap was 05/16/2016 Plan:  1. pap smear not done,  2. return every 2 years or PRN 3. Mammogram in 1 year Subjective:  Jasmine Weeks is a 42 y.o. female G2P2 who presents for annual exam. Patient's last menstrual period was 07/21/2017 (exact date). The patient has no complaints today. She does not do an at home breast exam. Prior to losing weight she would experience mild bladder incontinence, which has since improved. She denies any symptoms or complaints at this time. She had an abnormal Pap many years ago that required colposcopy.  Recently she has had q. 1 year Pap smears x3 all of which were normal we will not repeat Pap today, and the rationale behind going to every 3 year Pap's was discussed with the patient who prefers to go to every 3 year Pap The following portions of the patient's history were reviewed and updated as appropriate: allergies, current medications, past family history, past medical history, past social history, past surgical history and problem list. Past Medical History:  Diagnosis Date  . Abnormal Pap smear     Past Surgical History:  Procedure Laterality Date  . LEEP       Current Outpatient Medications:  .  phentermine 37.5 MG capsule, Take 37.5 mg by mouth every morning., Disp: , Rfl:   Review of Systems Constitutional: negative Gastrointestinal: negative Genitourinary: mild bladder incontinence, improving since weight loss  Objective:  BP 102/60 (BP Location: Left Arm, Patient Position: Sitting, Cuff Size: Normal)   Pulse 84   Ht 5\' 3"  (1.6 m)   Wt 147 lb 12.8 oz (67 kg)   LMP 07/21/2017 (Exact Date)   BMI 26.18 kg/m    BMI: Body mass index is 26.18 kg/m.  General Appearance: Alert, appropriate appearance for age. No acute distress HEENT: Grossly normal Neck / Thyroid:  Cardiovascular: RRR; normal S1, S2, no  murmur Lungs: CTA bilaterally Back: No CVAT Breast Exam: No dimpling, nipple retraction or discharge. No masses or nodes., Normal to inspection, Normal breast tissue bilaterally and No masses or nodes.No dimpling, nipple retraction or discharge. Gastrointestinal: Soft, non-tender, no masses or organomegaly Pelvic Exam:  External genitalia: normal general appearance Uterus: anterior and normal in size Rectovaginal: not indicated Lymphatic Exam: Non-palpable nodes in neck, clavicular, axillary, or inguinal regions Skin: no rash or abnormalities Neurologic: Normal gait and speech, no tremor  Psychiatric: Alert and oriented, appropriate affect.  Urinalysis:Not done  Christin BachJohn Roey Coopman. MD Pgr [phone] 10:48 AM   By signing my name below, I, Diona BrownerJennifer Gorman, attest that this documentation has been prepared under the direction and in the presence of Tilda BurrowFerguson, Jalan Bodi V, MD. Electronically Signed: Diona BrownerJennifer Gorman, Medical Scribe. 08/09/17. 10:48 AM.  I personally performed the services described in this documentation, which was SCRIBED in my presence. The recorded information has been reviewed and considered accurate. It has been edited as necessary during review. Tilda BurrowJohn V Tiarna Koppen, MD

## 2019-08-14 ENCOUNTER — Encounter: Payer: Self-pay | Admitting: Obstetrics and Gynecology

## 2020-03-27 ENCOUNTER — Other Ambulatory Visit: Payer: PRIVATE HEALTH INSURANCE | Admitting: Obstetrics and Gynecology

## 2020-04-20 ENCOUNTER — Ambulatory Visit (INDEPENDENT_AMBULATORY_CARE_PROVIDER_SITE_OTHER): Payer: PRIVATE HEALTH INSURANCE | Admitting: Obstetrics and Gynecology

## 2020-04-20 ENCOUNTER — Other Ambulatory Visit (HOSPITAL_COMMUNITY)
Admission: RE | Admit: 2020-04-20 | Discharge: 2020-04-20 | Disposition: A | Discharge status: Home / Self Care | Payer: No Typology Code available for payment source | Source: Ambulatory Visit | Attending: Obstetrics and Gynecology | Admitting: Obstetrics and Gynecology

## 2020-04-20 ENCOUNTER — Encounter: Payer: Self-pay | Admitting: Obstetrics and Gynecology

## 2020-04-20 VITALS — BP 134/74 | HR 95 | Ht 63.0 in | Wt 172.8 lb

## 2020-04-20 DIAGNOSIS — Z01419 Encounter for gynecological examination (general) (routine) without abnormal findings: Secondary | ICD-10-CM | POA: Diagnosis not present

## 2020-04-20 NOTE — Progress Notes (Signed)
Patient ID: Jasmine Weeks, female   DOB: 08-16-1975, 45 y.o.   MRN: [id number]   Assessment:  1.  Annual Gyn Exam 2 HEAvy menses.  2.  S/p LEEP around 2008 Plan:  1. Pap smear done, next pap due 2 years 2. Discussed option of IUD Mirena/Liletta for menstrual control 3. Return annually or prn 3    Annual mammogram advised after age 27 Subjective:  Jasmine Weeks is a 44 y.o. female G2P2 who presents for annual exam. No LMP recorded. The patient has heavy periods that last for 7 days. They are very heavy for the first 3-4 days and then taper off. She wears both pads and tampons for the first few days. She sometimes bleeds through her bottoms at night. Her husband had a vasectomy and she delivered two children vaginally.  The patient had a LEEP procedure around 2008. Her last pap on record was in 2017 but the patient states that she had a normal pap in 2019.  She has been getting mammograms and had a left breast biopsy last year which was normal. Denies nipple discharge.  The following portions of the patient's history were reviewed and updated as appropriate: allergies, current medications, past family history, past medical history, past social history, past surgical history and problem list. Past Medical History:  Diagnosis Date  . Abnormal Pap smear     Past Surgical History:  Procedure Laterality Date  . LEEP       Current Outpatient Medications:  .  phentermine 37.5 MG capsule, Take 37.5 mg by mouth every morning., Disp: , Rfl:   Review of Systems Constitutional: negative Gastrointestinal: negative Genitourinary: heavy periods  Objective:  There were no vitals taken for this visit.   BMI: There is no height or weight on file to calculate BMI.  General Appearance: Alert, appropriate appearance for age. No acute distress HEENT: Grossly normal Neck / Thyroid:  Cardiovascular: RRR; normal S1, S2, no murmur Lungs: CTA bilaterally Back: No CVAT Breast Exam: No dimpling,  nipple retraction or discharge. No masses or nodes., Normal to inspection, Normal breast tissue bilaterally and No masses or nodes.No dimpling, nipple retraction or discharge. Dr Simone____ Gastrointestinal: Soft, non-tender, no masses or organomegaly Pelvic Exam:  Vagina: clear mucous. Good support and vaginal length. Cervix: short. Bladder: No tenderness with palpation. Rectovaginal: Mild weakening of posterior wall. Guaiac negative stool obtained. Lymphatic Exam: Non-palpable nodes in neck, clavicular, axillary, or inguinal regions  Skin: no rash or abnormalities Neurologic: Normal gait and speech, no tremor  Psychiatric: Alert and oriented, appropriate affect.  Urinalysis:Not done  Christin Bach. MD Pgr [phone] 8:56 AM  By signing my name below, I, Pietro Cassis, attest that this documentation has been prepared under the direction and in the presence of Tilda Burrow, MD. Electronically Signed: Pietro Cassis, Medical Scribe. 04/20/20. 8:56 AM.

## 2020-04-21 LAB — CYTOLOGY - PAP
Comment: NEGATIVE
Diagnosis: NEGATIVE
High risk HPV: NEGATIVE

## 2021-07-01 ENCOUNTER — Other Ambulatory Visit: Payer: PRIVATE HEALTH INSURANCE | Admitting: Adult Health

## 2021-07-15 ENCOUNTER — Encounter: Payer: Self-pay | Admitting: Adult Health

## 2021-07-16 ENCOUNTER — Ambulatory Visit (INDEPENDENT_AMBULATORY_CARE_PROVIDER_SITE_OTHER): Payer: No Typology Code available for payment source | Admitting: Adult Health

## 2021-07-16 ENCOUNTER — Encounter: Payer: Self-pay | Admitting: Adult Health

## 2021-07-16 ENCOUNTER — Other Ambulatory Visit: Payer: Self-pay

## 2021-07-16 VITALS — BP 137/87 | HR 91 | Ht 62.0 in | Wt 175.0 lb

## 2021-07-16 DIAGNOSIS — Z01419 Encounter for gynecological examination (general) (routine) without abnormal findings: Secondary | ICD-10-CM | POA: Diagnosis not present

## 2021-07-16 DIAGNOSIS — F17200 Nicotine dependence, unspecified, uncomplicated: Secondary | ICD-10-CM | POA: Diagnosis not present

## 2021-07-16 NOTE — Progress Notes (Signed)
Patient ID: Jasmine Weeks, female   DOB: Dec 25, 1975, 45 y.o.   MRN: [id number] History of Present Illness: Jasmine Weeks is a 45 yea old white female,marriedm, G2P2 in for well woman gyn exam.  Lab Results  Component Value Date   DIAGPAP  04/20/2020    - Negative for intraepithelial lesion or malignancy (NILM)   HPV NOT DETECTED 05/16/2016   HPVHIGH Negative 04/20/2020   PCP is Dr Reuel Boom.  Current Medications, Allergies, Past Medical History, Past Surgical History, Family History and Social History were reviewed in Jasmine Weeks record.     Review of Systems: Patient denies any headaches, hearing loss, fatigue, blurred vision, shortness of breath, chest pain, abdominal pain, problems with bowel movements, urination, or intercourse. No joint pain or mood swings.     Physical Exam:BP 137/87 (BP Location: Left Arm, Patient Position: Sitting, Cuff Size: Normal)    Pulse 91    Ht 5\' 2"  (1.575 m)    Wt 175 lb (79.4 kg)    LMP 07/14/2021 (Exact Date)    BMI 32.01 kg/m   General:  Well developed, well nourished, no acute distress Skin:  Warm and dry Neck:  Midline trachea, normal thyroid, good ROM, no lymphadenopathy Lungs; Clear to auscultation bilaterally Breast:  No dominant palpable mass, retraction, or nipple discharge Cardiovascular: Regular rate and rhythm Abdomen:  Soft, non tender, no hepatosplenomegaly Pelvic:  External genitalia is normal in appearance, no lesions.  The vagina is normal in appearance.+period blood.  Urethra has no lesions or masses. The cervix is bulbous.  Uterus is felt to be normal size, shape, and contour.  No adnexal masses or tenderness noted.Bladder is non tender, no masses felt. Rectal: Deferred.  Extremities/musculoskeletal:  No swelling or varicosities noted, no clubbing or cyanosis Psych:  No mood changes, alert and cooperative,seems happy AA is 3 Fall risk is low Depression screen [facility] 2/9 07/16/2021 04/20/2020 08/09/2017  Decreased Interest 0  0 0  Down, Depressed, Hopeless 0 0 0  PHQ - 2 Score 0 0 0  Altered sleeping 0 0 -  Tired, decreased energy 1 0 -  Change in appetite 1 1 -  Feeling bad or failure about yourself  0 0 -  Trouble concentrating 0 0 -  Moving slowly or fidgety/restless 0 0 -  Suicidal thoughts 0 0 -  PHQ-9 Score 2 1 -  Difficult doing work/chores - Not difficult at all -    GAD 7 : Generalized Anxiety Score 07/16/2021 04/20/2020  Nervous, Anxious, on Edge 0 0  Control/stop worrying 0 0  Worry too much - different things 0 0  Trouble relaxing 0 0  Restless 0 0  Easily annoyed or irritable 0 0  Afraid - awful might happen 0 0  Total GAD 7 Score 0 0  Anxiety Difficulty - Not difficult at all    Upstream - 07/16/21 1055       Pregnancy Intention Screening   Does the patient want to become pregnant in the next year? No    Does the patient's partner want to become pregnant in the next year? No    Would the patient like to discuss contraceptive options today? No      Contraception Wrap Up   Current Method Vasectomy    End Method Vasectomy    Contraception Counseling Provided No              Examination chaperoned by 07/18/21 RN   Impression and Plan: 1. Encounter for well  woman exam with routine gynecological exam Physical in 1 year Pap in 2024 Mammogram normal 06/30/21 Labs done for insurance in November and good  2. Smoker She is planning to do Nicoderm in January

## 2022-07-18 ENCOUNTER — Ambulatory Visit (INDEPENDENT_AMBULATORY_CARE_PROVIDER_SITE_OTHER): Payer: No Typology Code available for payment source | Admitting: Adult Health

## 2022-07-18 ENCOUNTER — Encounter: Payer: Self-pay | Admitting: Adult Health

## 2022-07-18 VITALS — BP 124/76 | HR 87 | Ht 63.0 in | Wt 172.0 lb

## 2022-07-18 DIAGNOSIS — Z01419 Encounter for gynecological examination (general) (routine) without abnormal findings: Secondary | ICD-10-CM | POA: Diagnosis not present

## 2022-07-18 DIAGNOSIS — Z1211 Encounter for screening for malignant neoplasm of colon: Secondary | ICD-10-CM | POA: Diagnosis not present

## 2022-07-18 LAB — HEMOCCULT GUIAC POC 1CARD (OFFICE): Fecal Occult Blood, POC: NEGATIVE

## 2022-07-18 NOTE — Progress Notes (Signed)
Patient ID: Jasmine Weeks, female   DOB: 20-Dec-1975, 46 y.o.   MRN: [id number] History of Present Illness: Jasmine Weeks is a 46 year old white female,married, G2P2 in for a well woman gyn exam.  Last pap was 04/29/20 and negative HPV and malignancy  PCP is Dr Reuel Boom    Current Medications, Allergies, Past Medical History, Past Surgical History, Family History and Social History were reviewed in [employer] electronic medical record.     Review of Systems: Patient denies any headaches, hearing loss, fatigue, blurred vision, shortness of breath, chest pain, abdominal pain, problems with bowel movements, urination, or intercourse. No joint pain or mood swings.  Have few hot flashes, wakes up around 3 often Has some discomfort right shoulder, does work on Animator    Physical Exam:BP 124/76 (BP Location: Left Arm, Patient Position: Sitting, Cuff Size: Normal)   Pulse 87   Ht 5\' 3"  (1.6 m)   Wt 172 lb (78 kg)   LMP 06/23/2022 (Exact Date)   BMI 30.47 kg/m   General:  Well developed, well nourished, no acute distress Skin:  Warm and dry Neck:  Midline trachea, normal thyroid, good ROM, no lymphadenopathy Lungs; Clear to auscultation bilaterally Breast:  No dominant palpable mass, retraction, or nipple discharge Cardiovascular: Regular rate and rhythm Abdomen:  Soft, non tender, no hepatosplenomegaly Pelvic:  External genitalia is normal in appearance, no lesions.  The vagina is normal in appearance. Urethra has no lesions or masses. The cervix is bulbous.  Uterus is felt to be normal size, shape, and contour.  No adnexal masses or tenderness noted.Bladder is non tender, no masses felt. Rectal: Good sphincter tone, no polyps, or hemorrhoids felt.  Hemoccult negative. Extremities/musculoskeletal:  No swelling or varicosities noted, no clubbing or cyanosis,can raise right arm over head without pain  Psych:  No mood changes, alert and cooperative,seems happy AA is 2 Fall risk is low     07/18/2022    8:35 AM 07/16/2021   10:55 AM 04/20/2020    9:02 AM  Depression screen PHQ 2/9  Decreased Interest 0 0 0  Down, Depressed, Hopeless 0 0 0  PHQ - 2 Score 0 0 0  Altered sleeping 1 0 0  Tired, decreased energy 1 1 0  Change in appetite 0 1 1  Feeling bad or failure about yourself  0 0 0  Trouble concentrating 0 0 0  Moving slowly or fidgety/restless 0 0 0  Suicidal thoughts 0 0 0  PHQ-9 Score 2 2 1   Difficult doing work/chores   Not difficult at all       07/18/2022    8:36 AM 07/16/2021   10:55 AM 04/20/2020    9:03 AM  GAD 7 : Generalized Anxiety Score  Nervous, Anxious, on Edge 0 0 0  Control/stop worrying 0 0 0  Worry too much - different things 0 0 0  Trouble relaxing 0 0 0  Restless 0 0 0  Easily annoyed or irritable 0 0 0  Afraid - awful might happen 0 0 0  Total GAD 7 Score 0 0 0  Anxiety Difficulty   Not difficult at all      Upstream - 07/18/22 04/22/2020       Pregnancy Intention Screening   Does the patient want to become pregnant in the next year? No    Does the patient's partner want to become pregnant in the next year? No    Would the patient like to discuss contraceptive options today? No  Contraception Wrap Up   Current Method Vasectomy    End Method Vasectomy    Contraception Counseling Provided No             Examination chaperoned by Malachy Mood LPN   Impression and Plan: 1. Encounter for well woman exam with routine gynecological exam Pap and physical in 1 year Labs with PCP Mammogram was negative 07/04/22 at [facility] Colonoscopy at 47   2. Encounter for screening fecal occult blood testing Hemoccult was negative

## 2023-07-21 ENCOUNTER — Other Ambulatory Visit (HOSPITAL_COMMUNITY)
Admission: RE | Admit: 2023-07-21 | Discharge: 2023-07-21 | Disposition: A | Discharge status: Home / Self Care | Payer: No Typology Code available for payment source | Source: Ambulatory Visit | Attending: Adult Health | Admitting: Adult Health

## 2023-07-21 ENCOUNTER — Encounter: Payer: Self-pay | Admitting: Adult Health

## 2023-07-21 ENCOUNTER — Ambulatory Visit: Payer: No Typology Code available for payment source | Admitting: Adult Health

## 2023-07-21 VITALS — BP 151/104 | HR 98 | Ht 63.0 in | Wt 184.0 lb

## 2023-07-21 DIAGNOSIS — Z01419 Encounter for gynecological examination (general) (routine) without abnormal findings: Secondary | ICD-10-CM | POA: Diagnosis present

## 2023-07-21 DIAGNOSIS — Z1211 Encounter for screening for malignant neoplasm of colon: Secondary | ICD-10-CM | POA: Diagnosis not present

## 2023-07-21 DIAGNOSIS — I10 Essential (primary) hypertension: Secondary | ICD-10-CM

## 2023-07-21 DIAGNOSIS — Z1331 Encounter for screening for depression: Secondary | ICD-10-CM

## 2023-07-21 DIAGNOSIS — Z1212 Encounter for screening for malignant neoplasm of rectum: Secondary | ICD-10-CM

## 2023-07-21 DIAGNOSIS — Z566 Other physical and mental strain related to work: Secondary | ICD-10-CM

## 2023-07-21 DIAGNOSIS — R635 Abnormal weight gain: Secondary | ICD-10-CM | POA: Insufficient documentation

## 2023-07-21 DIAGNOSIS — N951 Menopausal and female climacteric states: Secondary | ICD-10-CM | POA: Insufficient documentation

## 2023-07-21 LAB — HEMOCCULT GUIAC POC 1CARD (OFFICE): Fecal Occult Blood, POC: NEGATIVE

## 2023-07-21 MED ORDER — LISINOPRIL 10 MG PO TABS: 10.0000 mg | ORAL_TABLET | Freq: Every day | ORAL | 3 refills | Status: DC

## 2023-07-21 NOTE — Progress Notes (Signed)
Patient ID: Henli Murdy, female   DOB: Jan 20, 1976, 47 y.o.   MRN: [id number] History of Present Illness: Zorana is a 47 year old white female, married, G2P2002 in for a well woman gyn exam and pap. She had her mammogram at [facility] this morning.  PCP is Dr Reuel Boom.   Current Medications, Allergies, Past Medical History, Past Surgical History, Family History and Social History were reviewed in [employer] record.     Review of Systems: Patient denies any headaches, hearing loss, fatigue, blurred vision, shortness of breath, chest pain, abdominal pain, problems with bowel movements, urination, or intercourse. No joint pain or mood swings.  Has gained weight Stress at work Periods irregular at times, has not missed can be cramping with clots too.    Physical Exam:BP (!) 151/104 (BP Location: Left Arm, Patient Position: Sitting, Cuff Size: Normal)   Pulse 98   Ht 5\' 3"  (1.6 m)   Wt 184 lb (83.5 kg)   LMP 07/13/2023 (Exact Date)   BMI 32.59 kg/m   General:  Well developed, well nourished, no acute distress Skin:  Warm and dry Neck:  Midline trachea, normal thyroid, good ROM, no lymphadenopathy Lungs; Clear to auscultation bilaterally Breast:  No dominant palpable mass, retraction, or nipple discharge Cardiovascular: Regular rate and rhythm Abdomen:  Soft, non tender, no hepatosplenomegaly Pelvic:  External genitalia is normal in appearance, no lesions.  The vagina is normal in appearance. Urethra has no lesions or masses. The cervix is bulbous, irregular at os, (sp LEEP, years ago), pap with HR  HPV genotyping performed.  Uterus is felt to be normal size, shape, and contour.  No adnexal masses or tenderness noted.Bladder is non tender, no masses felt. Rectal: Good sphincter tone, no polyps, or hemorrhoids felt.  Hemoccult negative. Extremities/musculoskeletal:  No swelling or varicosities noted, no clubbing or cyanosis Psych:  No mood changes, alert and  cooperative,seems happy AA is 3 Fall risk is low    07/21/2023    9:31 AM 07/18/2022    8:35 AM 07/16/2021   10:55 AM  Depression screen PHQ 2/9  Decreased Interest 0 0 0  Down, Depressed, Hopeless 0 0 0  PHQ - 2 Score 0 0 0  Altered sleeping 1 1 0  Tired, decreased energy 1 1 1   Change in appetite 1 0 1  Feeling bad or failure about yourself  0 0 0  Trouble concentrating 0 0 0  Moving slowly or fidgety/restless 0 0 0  Suicidal thoughts 0 0 0  PHQ-9 Score 3 2 2        07/21/2023    9:31 AM 07/18/2022    8:36 AM 07/16/2021   10:55 AM 04/20/2020    9:03 AM  GAD 7 : Generalized Anxiety Score  Nervous, Anxious, on Edge 1 0 0 0  Control/stop worrying 0 0 0 0  Worry too much - different things 1 0 0 0  Trouble relaxing 0 0 0 0  Restless 0 0 0 0  Easily annoyed or irritable 1 0 0 0  Afraid - awful might happen 0 0 0 0  Total GAD 7 Score 3 0 0 0  Anxiety Difficulty    Not difficult at all    Upstream - 07/21/23 8469       Pregnancy Intention Screening   Does the patient want to become pregnant in the next year? No    Would the patient like to discuss contraceptive options today? No  Contraception Wrap Up   Current Method Vasectomy    End Method Vasectomy    Contraception Counseling Provided No              Examination chaperoned by Freddie Apley RN   Impression and Plan: 1. Encounter for routine gynecological examination with Papanicolaou smear of cervix (Primary) Pap sent Pap in 3 years if normal Physical in 1 year Labs with PCP Had mammogram this morning at [facility]   2. Encounter for screening fecal occult blood testing Hemoccult was negative  - POCT occult blood stool  3. Hypertension, unspecified type Will rx Zestril 10 mg 1 daily  Decrease salt and sugar Review DASH eating plan Meds ordered this encounter  Medications   lisinopril (ZESTRIL) 10 MG tablet    Sig: Take 1 tablet (10 mg total) by mouth daily.    Dispense:  30 tablet     Refill:  3    Supervising Provider:   Duane Lope H [[id number]]   Will recheck BP in 6 weeks   4. Screening for colorectal cancer Referred to [facility] for colonoscopy  - Ambulatory referral to Gastroenterology  5. Stress at work Try to take time for self  6. Weight gain  7. Perimenopause

## 2023-07-21 NOTE — Addendum Note (Signed)
Addended by: Annamarie Dawley on: 07/21/2023 11:34 AM   Modules accepted: Orders

## 2023-07-24 ENCOUNTER — Encounter (INDEPENDENT_AMBULATORY_CARE_PROVIDER_SITE_OTHER): Payer: Self-pay | Admitting: *Deleted

## 2023-07-25 LAB — CYTOLOGY - PAP
Comment: NEGATIVE
Diagnosis: NEGATIVE
Diagnosis: REACTIVE
High risk HPV: NEGATIVE

## 2023-08-01 ENCOUNTER — Telehealth: Payer: Self-pay | Admitting: *Deleted

## 2023-08-01 NOTE — Telephone Encounter (Signed)
Left message @ 4:20 pm letting pt know that mammogram from [facility] [place] was normal, no findings suspicious for malignancy. Next mammogram in 1 year. Pt should get a reminder letter. JSY

## 2023-08-14 ENCOUNTER — Encounter: Payer: Self-pay | Admitting: Adult Health

## 2023-08-14 ENCOUNTER — Ambulatory Visit (INDEPENDENT_AMBULATORY_CARE_PROVIDER_SITE_OTHER): Payer: No Typology Code available for payment source | Admitting: Adult Health

## 2023-08-14 VITALS — BP 157/113 | HR 97 | Ht 63.0 in | Wt 184.5 lb

## 2023-08-14 DIAGNOSIS — R21 Rash and other nonspecific skin eruption: Secondary | ICD-10-CM | POA: Diagnosis not present

## 2023-08-14 DIAGNOSIS — I10 Essential (primary) hypertension: Secondary | ICD-10-CM | POA: Diagnosis not present

## 2023-08-14 MED ORDER — LOSARTAN POTASSIUM 25 MG PO TABS: 25.0000 mg | ORAL_TABLET | Freq: Every day | ORAL | 3 refills | Status: DC

## 2023-08-14 NOTE — Progress Notes (Signed)
  Subjective:     Patient ID: Jasmine Weeks, female   DOB: Jul 22, 1976, 48 y.o.   MRN: [id number]  HPI [name] is a 48 year old white female,married, G2P2 in for rash on chest with itching that started about a week after starting lisinopril  for BP, about 07/21/23, but had used bath and body wash too, she stopped that and still has rash with itching. Did not take lisinopril  today.     Component Value Date/Time   DIAGPAP  07/21/2023 1134    - Negative for Intraepithelial Lesions or Malignancy (NILM)   DIAGPAP - Benign reactive/reparative changes 07/21/2023 1134   DIAGPAP  04/20/2020 0919    - Negative for intraepithelial lesion or malignancy (NILM)   HPVHIGH Negative 07/21/2023 1134   HPVHIGH Negative 04/20/2020 0919   ADEQPAP  07/21/2023 1134    Satisfactory for evaluation; transformation zone component PRESENT.   ADEQPAP  04/20/2020 0919    Satisfactory for evaluation; transformation zone component PRESENT.   ADEQPAP  05/16/2016 0000    Satisfactory for evaluation  endocervical/transformation zone component PRESENT.   PCP is Dr Toribio  Review of Systems Has rash on chest with some itching Reviewed past medical,surgical, social and family history. Reviewed medications and allergies.     Objective:   Physical Exam BP (!) 157/113 (BP Location: Right Arm, Patient Position: Sitting, Cuff Size: Normal)   Pulse 97   Ht 5' 3 (1.6 m)   Wt 184 lb 8 oz (83.7 kg)   LMP 08/10/2023 (Approximate)   BMI 32.68 kg/m     Skin warm and dry.  Lungs: clear to ausculation bilaterally. Cardiovascular: regular rate and rhythm. Has red rash on chest,slightly raised.   Upstream - 08/14/23 1558       Pregnancy Intention Screening   Does the patient want to become pregnant in the next year? No    Does the patient's partner want to become pregnant in the next year? No    Would the patient like to discuss contraceptive options today? No      Contraception Wrap Up   Current Method Vasectomy    End Method  Vasectomy    Contraception Counseling Provided No             Assessment:     1. Rash, skin (Primary) Stop lisinopril  Take benadryl tonight  2. Hypertension, unspecified type Will rx losaratan 25 mg 1 daily  Meds ordered this encounter  Medications   losartan  (COZAAR ) 25 MG tablet    Sig: Take 1 tablet (25 mg total) by mouth daily.    Dispense:  30 tablet    Refill:  3    Supervising Provider:   JAYNE VONN DEL [[id number]]   Keep check on BP at home and let me know and a few days the readings     Plan:     Follow up in 4 week for BP check and ROS

## 2023-09-01 ENCOUNTER — Ambulatory Visit: Payer: No Typology Code available for payment source | Admitting: Adult Health

## 2023-09-11 ENCOUNTER — Encounter: Payer: Self-pay | Admitting: Adult Health

## 2023-09-11 ENCOUNTER — Ambulatory Visit: Payer: No Typology Code available for payment source | Admitting: Adult Health

## 2023-09-11 VITALS — BP 163/107 | HR 87 | Ht 62.0 in | Wt 187.0 lb

## 2023-09-11 DIAGNOSIS — I10 Essential (primary) hypertension: Secondary | ICD-10-CM

## 2023-09-11 MED ORDER — LOSARTAN POTASSIUM 50 MG PO TABS: 50.0000 mg | ORAL_TABLET | Freq: Every day | ORAL | 1 refills | Status: DC

## 2023-09-11 NOTE — Progress Notes (Signed)
  Subjective:     Patient ID: Jasmine Weeks, female   DOB: 10/28/1975, 48 y.o.   MRN: [id number]  HPI [name] is a 48 year old white female, married, G2P2002 in for BP check. Her rash resolved from lisinopril , taking losatan, and has kept check on BP at home, some readings are 136/81,143/98, 155/105. Has had a few headaches.     Component Value Date/Time   DIAGPAP  07/21/2023 1134    - Negative for Intraepithelial Lesions or Malignancy (NILM)   DIAGPAP - Benign reactive/reparative changes 07/21/2023 1134   DIAGPAP  04/20/2020 0919    - Negative for intraepithelial lesion or malignancy (NILM)   HPVHIGH Negative 07/21/2023 1134   HPVHIGH Negative 04/20/2020 0919   ADEQPAP  07/21/2023 1134    Satisfactory for evaluation; transformation zone component PRESENT.   ADEQPAP  04/20/2020 0919    Satisfactory for evaluation; transformation zone component PRESENT.   ADEQPAP  05/16/2016 0000    Satisfactory for evaluation  endocervical/transformation zone component PRESENT.   PCP is Dr Bearl Limes.  Review of Systems Had a few headaches Rash has resolved   Reviewed past medical,surgical, social and family history. Reviewed medications and allergies.  Objective:   Physical Exam BP (!) 163/107 (BP Location: Left Arm, Patient Position: Sitting, Cuff Size: Normal)   Pulse 87   Ht 5\' 2"  (1.575 m)   Wt 187 lb (84.8 kg)   LMP 09/09/2023 (Exact Date)   BMI 34.20 kg/m     Skin warm and dry. Lungs: clear to ausculation bilaterally. Cardiovascular: regular rate and rhythm.   Upstream - 09/11/23 0913       Pregnancy Intention Screening   Does the patient want to become pregnant in the next year? No    Does the patient's partner want to become pregnant in the next year? No    Would the patient like to discuss contraceptive options today? No      Contraception Wrap Up   Current Method Vasectomy    End Method Vasectomy    Contraception Counseling Provided No             Assessment:     1.  Hypertension, unspecified type (Primary) Will increase losartan  to 50 mg 1 daily Meds ordered this encounter  Medications   losartan  (COZAAR ) 50 MG tablet    Sig: Take 1 tablet (50 mg total) by mouth daily.    Dispense:  90 tablet    Refill:  1    Supervising Provider:   Wendelyn Halter [[id number]]      Keep check on BP at home  Plan:     Follow up in 6 weeks for BP check and ROS

## 2023-09-14 ENCOUNTER — Telehealth: Payer: Self-pay | Admitting: Internal Medicine

## 2023-09-14 NOTE — Telephone Encounter (Signed)
Emma with [facility] called and asked that we send patient another letter for colonoscopy

## 2023-10-11 ENCOUNTER — Telehealth: Payer: Self-pay | Admitting: *Deleted

## 2023-10-11 DIAGNOSIS — Z1211 Encounter for screening for malignant neoplasm of colon: Secondary | ICD-10-CM

## 2023-10-11 NOTE — Telephone Encounter (Signed)
  Procedure: colonoscopy  Height: 5'3" Weight: 180 lb      BMI: 31.9  Have you had a colonoscopy before?  no  Do you have family history of colon cancer?  Yes,grandmother (maternal)  Do you have a family history of polyps? no  Previous colonoscopy with polyps removed? no  Do you have a history colorectal cancer?   no  Are you diabetic?  no  Do you have a prosthetic or mechanical heart valve? no  Do you have a pacemaker/defibrillator?   no  Have you had endocarditis/atrial fibrillation?  no  Do you use supplemental oxygen/CPAP?  no  Have you had joint replacement within the last 12 months?  no  Do you tend to be constipated or have to use laxatives?  no   Do you have history of alcohol use? If yes, how much and how often.  no  Do you have history or are you using drugs? If yes, what do are you  using?  no  Have you ever had a stroke/heart attack?  no  Have you ever had a heart or other vascular stent placed,?no  Do you take weight loss medication? no  female patients,: have you had a hysterectomy? no                              are you post menopausal?  no                              do you still have your menstrual cycle? yes    Date of last menstrual period? 09/10/23  Do you take any blood-thinning medications such as: (Plavix, aspirin, Coumadin, Aggrenox, Brilinta, Xarelto, Eliquis, Pradaxa, Savaysa or Effient)? no  If yes we need the name, milligram, dosage and who is prescribing doctor:  n/a             Current Outpatient Medications  Medication Sig Dispense Refill   loratadine (CLARITIN) 10 MG tablet Take 10 mg by mouth daily.     losartan (COZAAR) 50 MG tablet Take 1 tablet (50 mg total) by mouth daily. 90 tablet 1   montelukast (SINGULAIR) 10 MG tablet Take 10 mg by mouth at bedtime.     No current facility-administered medications for this visit.    Allergies  Allergen Reactions   Penicillins    Sulfa Antibiotics    Lisinopril Rash

## 2023-10-23 ENCOUNTER — Ambulatory Visit: Payer: No Typology Code available for payment source | Admitting: Adult Health

## 2023-10-23 ENCOUNTER — Encounter: Payer: Self-pay | Admitting: Adult Health

## 2023-10-23 VITALS — BP 137/100 | HR 100 | Ht 63.0 in | Wt 187.0 lb

## 2023-10-23 DIAGNOSIS — I10 Essential (primary) hypertension: Secondary | ICD-10-CM

## 2023-10-23 MED ORDER — LOSARTAN POTASSIUM-HCTZ 50-12.5 MG PO TABS: 1.0000 | ORAL_TABLET | Freq: Every day | ORAL | 3 refills | Status: DC

## 2023-10-23 NOTE — Progress Notes (Signed)
  Subjective:     Patient ID: Jasmine Weeks, female   DOB: 1976-07-11, 48 y.o.   MRN: [id number]  HPI Jasmine Weeks is a 48 year old white female, married, G2P2002 in for BP, after increasing losartan to 50 mg 09/11/23. BP had home has ranged 151/106 in beginning to about 131/88 recently at lowest.     Component Value Date/Time   DIAGPAP  07/21/2023 1134    - Negative for Intraepithelial Lesions or Malignancy (NILM)   DIAGPAP - Benign reactive/reparative changes 07/21/2023 1134   DIAGPAP  04/20/2020 0919    - Negative for intraepithelial lesion or malignancy (NILM)   HPVHIGH Negative 07/21/2023 1134   HPVHIGH Negative 04/20/2020 0919   ADEQPAP  07/21/2023 1134    Satisfactory for evaluation; transformation zone component PRESENT.   ADEQPAP  04/20/2020 0919    Satisfactory for evaluation; transformation zone component PRESENT.   ADEQPAP  05/16/2016 0000    Satisfactory for evaluation  endocervical/transformation zone component PRESENT.  PCP is Dr Reuel Boom  Review of Systems Has had rash on chest again,?allergies  Hands feel tight at times, no swelling in feet or ankles  Reviewed past medical,surgical, social and family history. Reviewed medications and allergies.     Objective:   Physical Exam BP (!) 137/100 (BP Location: Left Arm, Patient Position: Sitting, Cuff Size: Normal)   Pulse 100   Ht 5\' 3"  (1.6 m)   Wt 187 lb (84.8 kg)   LMP 10/10/2023 (Approximate)   BMI 33.13 kg/m     Skin warm and dry.  Lungs: clear to ausculation bilaterally. Cardiovascular: regular rate and rhythm.  Fall risk is low  Upstream - 10/23/23 1532       Pregnancy Intention Screening   Does the patient want to become pregnant in the next year? No    Does the patient's partner want to become pregnant in the next year? No    Would the patient like to discuss contraceptive options today? No      Contraception Wrap Up   Current Method Vasectomy    End Method Vasectomy    Contraception Counseling Provided No              Assessment:     1. Hypertension, unspecified type (Primary) Will add Microzide 12.5 mg  to losartan 50 mg  1 daily Meds ordered this encounter  Medications   losartan-hydrochlorothiazide (HYZAAR) 50-12.5 MG tablet    Sig: Take 1 tablet by mouth daily.    Dispense:  30 tablet    Refill:  3    Supervising Provider:   Duane Lope H [[id number]]   Decrease salt and  sugar     Plan:     Follow up in 6 weeks for BP check and ROS

## 2023-11-29 NOTE — Telephone Encounter (Signed)
 ASA 2, okay to schedule.   UPT preop

## 2023-11-30 ENCOUNTER — Telehealth: Payer: Self-pay | Admitting: *Deleted

## 2023-11-30 MED ORDER — PEG 3350-KCL-NA BICARB-NACL 420 G PO SOLR: 4000.0000 mL | Freq: Once | ORAL | 0 refills | Status: AC

## 2023-11-30 NOTE — Telephone Encounter (Signed)
 Spoke with pt. She has been scheduled for 5/23. Aware will send instructions to her and rx for prep sent to pharmacy

## 2023-11-30 NOTE — Addendum Note (Signed)
 Addended by: Feliz Hosteller on: 11/30/2023 08:40 AM   Modules accepted: Orders

## 2023-11-30 NOTE — Telephone Encounter (Signed)
 Called [facility] and spoke with Nicholes Barks. PA is required.  PA approved auth# [id number], DOS 12/22/2023 Call ref# [id number]

## 2023-11-30 NOTE — Telephone Encounter (Signed)
 duplicate

## 2023-12-04 ENCOUNTER — Ambulatory Visit (INDEPENDENT_AMBULATORY_CARE_PROVIDER_SITE_OTHER): Admitting: Adult Health

## 2023-12-04 ENCOUNTER — Encounter: Payer: Self-pay | Admitting: Adult Health

## 2023-12-04 VITALS — BP 122/89 | HR 98 | Ht 63.0 in | Wt 188.0 lb

## 2023-12-04 DIAGNOSIS — I10 Essential (primary) hypertension: Secondary | ICD-10-CM | POA: Diagnosis not present

## 2023-12-04 MED ORDER — LOSARTAN POTASSIUM-HCTZ 50-12.5 MG PO TABS: 1.0000 | ORAL_TABLET | Freq: Every day | ORAL | 1 refills | Status: DC

## 2023-12-04 NOTE — Progress Notes (Signed)
  Subjective:     Patient ID: Jasmine Weeks, female   DOB: [birth year], 48 y.o.   MRN: [id number]  HPI Jasmine Weeks is a 48 year old white female,married, G2P2002, back in follow up on starting hyzaatr 50-12.5 mg for BP. BP at home ranging from 126/97, 132/91,140/89 to hi of 156/104.     Component Value Date/Time   DIAGPAP  07/21/2023 1134    - Negative for Intraepithelial Lesions or Malignancy (NILM)   DIAGPAP - Benign reactive/reparative changes 07/21/2023 1134   DIAGPAP  04/20/2020 0919    - Negative for intraepithelial lesion or malignancy (NILM)   HPVHIGH Negative 07/21/2023 1134   HPVHIGH Negative 04/20/2020 0919   ADEQPAP  07/21/2023 1134    Satisfactory for evaluation; transformation zone component PRESENT.   ADEQPAP  04/20/2020 0919    Satisfactory for evaluation; transformation zone component PRESENT.   ADEQPAP  05/16/2016 0000    Satisfactory for evaluation  endocervical/transformation zone component PRESENT.   PCP is Dr Jasmine Weeks   Review of Systems No complaints, no headaches Reviewed past medical,surgical, social and family history. Reviewed medications and allergies.     Objective:   Physical Exam BP 122/89 (BP Location: Left Arm, Patient Position: Sitting, Cuff Size: Normal)   Pulse 98   Ht 5\' 3"  (1.6 m)   Wt 188 lb (85.3 kg)   LMP 11/10/2023 (Approximate)   BMI 33.30 kg/m     Skin warm and dry. Lungs: clear to ausculation bilaterally. Cardiovascular: regular rate and rhythm.  Fall risk is low  Upstream - 12/04/23 0914       Pregnancy Intention Screening   Does the patient want to become pregnant in the next year? No    Does the patient's partner want to become pregnant in the next year? No    Would the patient like to discuss contraceptive options today? No      Contraception Wrap Up   Current Method Vasectomy    End Method Vasectomy    Contraception Counseling Provided No             Assessment:     1. Hypertension, unspecified type (Primary) BP is  better Keep check at home Continue to watch salt and sugar  Will refill hyzaar 50-12.5 mg 1 daily Meds ordered this encounter  Medications   losartan -hydrochlorothiazide (HYZAAR) 50-12.5 MG tablet    Sig: Take 1 tablet by mouth daily.    Dispense:  90 tablet    Refill:  1    Supervising Provider:   Wendelyn Weeks [[id number]]       Plan:     Return 07/29/24 for physical

## 2023-12-20 ENCOUNTER — Other Ambulatory Visit (HOSPITAL_COMMUNITY)
Admission: RE | Admit: 2023-12-20 | Discharge: 2023-12-20 | Disposition: A | Discharge status: Home / Self Care | Source: Ambulatory Visit | Attending: Internal Medicine | Admitting: Internal Medicine

## 2023-12-20 DIAGNOSIS — Z1211 Encounter for screening for malignant neoplasm of colon: Secondary | ICD-10-CM | POA: Insufficient documentation

## 2023-12-20 LAB — PREGNANCY, URINE: Preg Test, Ur: NEGATIVE

## 2023-12-22 ENCOUNTER — Encounter (HOSPITAL_COMMUNITY)
Admission: RE | Disposition: A | Discharge status: Home / Self Care | Payer: Self-pay | Source: Home / Self Care | Attending: Internal Medicine

## 2023-12-22 ENCOUNTER — Ambulatory Visit (HOSPITAL_COMMUNITY)
Admission: RE | Admit: 2023-12-22 | Discharge: 2023-12-22 | Disposition: A | Discharge status: Home / Self Care | Attending: Internal Medicine | Admitting: Internal Medicine

## 2023-12-22 ENCOUNTER — Ambulatory Visit (HOSPITAL_COMMUNITY)

## 2023-12-22 ENCOUNTER — Other Ambulatory Visit: Payer: Self-pay

## 2023-12-22 ENCOUNTER — Encounter (HOSPITAL_COMMUNITY): Payer: Self-pay | Admitting: Internal Medicine

## 2023-12-22 DIAGNOSIS — K5730 Diverticulosis of large intestine without perforation or abscess without bleeding: Secondary | ICD-10-CM | POA: Diagnosis not present

## 2023-12-22 DIAGNOSIS — K648 Other hemorrhoids: Secondary | ICD-10-CM | POA: Diagnosis not present

## 2023-12-22 DIAGNOSIS — Z1211 Encounter for screening for malignant neoplasm of colon: Secondary | ICD-10-CM | POA: Diagnosis present

## 2023-12-22 DIAGNOSIS — F17290 Nicotine dependence, other tobacco product, uncomplicated: Secondary | ICD-10-CM | POA: Diagnosis not present

## 2023-12-22 DIAGNOSIS — Z980 Intestinal bypass and anastomosis status: Secondary | ICD-10-CM | POA: Insufficient documentation

## 2023-12-22 HISTORY — PX: COLONOSCOPY: SHX5424

## 2023-12-22 SURGERY — COLONOSCOPY
Anesthesia: General

## 2023-12-22 MED ORDER — LIDOCAINE 2% (20 MG/ML) 5 ML SYRINGE
INTRAMUSCULAR | Status: DC | PRN
  Administered 2023-12-22: 60 mg via INTRAVENOUS

## 2023-12-22 MED ORDER — PROPOFOL 10 MG/ML IV BOLUS
INTRAVENOUS | Status: DC | PRN
  Administered 2023-12-22: 100 mg via INTRAVENOUS

## 2023-12-22 MED ORDER — LACTATED RINGERS IV SOLN: INTRAVENOUS | Status: DC

## 2023-12-22 MED ORDER — PROPOFOL 500 MG/50ML IV EMUL
INTRAVENOUS | Status: DC | PRN
  Administered 2023-12-22: 150 ug/kg/min via INTRAVENOUS

## 2023-12-22 NOTE — H&P (Signed)
 @LOGO @   Primary Care Physician:  Leesa Pulling, MD Primary Gastroenterologist:  Dr. Riley Cheadle  Pre-Procedure History & Physical: HPI:  Jasmine Weeks is a 48 y.o. female here for first-ever screening colonoscopy.  No family history of colon cancer in any first-degree relatives.  No symptoms.  Past Medical History:  Diagnosis Date   Abnormal Pap smear    Hypertension    Vaginal Pap smear, abnormal     Past Surgical History:  Procedure Laterality Date   BREAST BIOPSY Left    LEEP      Prior to Admission medications   Medication Sig Start Date End Date Taking? Authorizing Provider  losartan -hydrochlorothiazide (HYZAAR) 50-12.5 MG tablet Take 1 tablet by mouth daily. 12/04/23  Yes Javan Messing, NP    Allergies as of 11/30/2023 - Review Complete 10/23/2023  Allergen Reaction Noted   Penicillins  01/21/2013   Sulfa antibiotics  01/21/2013   Lisinopril  Rash 09/11/2023    Family History  Problem Relation Age of Onset   Hypertension Mother    Coronary artery disease Mother    Diabetes Maternal Aunt    Cancer Maternal Grandmother        colon and bladder cancer   Diabetes Maternal Grandfather    Hypertension Maternal Grandfather    Coronary artery disease Maternal Grandfather    Alzheimer's disease Paternal Grandmother    Alzheimer's disease Paternal Grandfather    Diabetes Paternal Grandfather     Social History   Socioeconomic History   Marital status: Married    Spouse name: Not on file   Number of children: 2   Years of education: Not on file   Highest education level: Not on file  Occupational History   Not on file  Tobacco Use   Smoking status: Former    Current packs/day: 0.50    Average packs/day: 0.5 packs/day for 17.0 years (8.5 ttl pk-yrs)    Types: Cigarettes   Smokeless tobacco: Never  Vaping Use   Vaping status: Some Days  Substance and Sexual Activity   Alcohol use: Yes    Comment: occassional   Drug use: No   Sexual activity: Yes     Birth control/protection: Other-see comments    Comment: husband with vasectomy  Other Topics Concern   Not on file  Social History Narrative   Not on file   Social Drivers of Health   Financial Resource Strain: Low Risk  (07/21/2023)   Overall Financial Resource Strain (CARDIA)    Difficulty of Paying Living Expenses: Not hard at all  Food Insecurity: No Food Insecurity (07/21/2023)   Hunger Vital Sign    Worried About Running Out of Food in the Last Year: Never true    Ran Out of Food in the Last Year: Never true  Transportation Needs: No Transportation Needs (07/21/2023)   PRAPARE - Administrator, Civil Service (Medical): No    Lack of Transportation (Non-Medical): No  Physical Activity: Inactive (07/21/2023)   Exercise Vital Sign    Days of Exercise per Week: 0 days    Minutes of Exercise per Session: 0 min  Stress: Stress Concern Present (07/21/2023)   [employer] of Occupational Health - Occupational Stress Questionnaire    Feeling of Stress : To some extent  Social Connections: Moderately Integrated (07/21/2023)   Social Connection and Isolation Panel [NHANES]    Frequency of Communication with Friends and Family: More than three times a week    Frequency of Social Gatherings  with Friends and Family: Once a week    Attends Religious Services: More than 4 times per year    Active Member of Clubs or Organizations: No    Attends Banker Meetings: Never    Marital Status: Married  Catering manager Violence: Not At Risk (07/21/2023)   Humiliation, Afraid, Rape, and Kick questionnaire    Fear of Current or Ex-Partner: No    Emotionally Abused: No    Physically Abused: No    Sexually Abused: No    Review of Systems: See HPI, otherwise negative ROS  Physical Exam: BP 134/86   Pulse 90   Temp 98.1 F (36.7 C) (Oral)   Resp (!) 22   LMP 12/17/2023 (Approximate)   SpO2 100%  General:   Alert,  Well-developed, well-nourished, pleasant  and cooperative in NAD Neck:  Supple; no masses or thyromegaly. No significant cervical adenopathy. Lungs:  Clear throughout to auscultation.   No wheezes, crackles, or rhonchi. No acute distress. Heart:  Regular rate and rhythm; no murmurs, clicks, rubs,  or gallops. Abdomen: Non-distended, normal bowel sounds.  Soft and nontender without appreciable mass or hepatosplenomegaly.   Impression/Plan: 48 year old lady here for first-ever average risk screening colonoscopy. The risks, benefits, limitations, alternatives and imponderables have been reviewed with the patient. Questions have been answered. All parties are agreeable.       Notice: This dictation was prepared with Dragon dictation along with smaller phrase technology. Any transcriptional errors that result from this process are unintentional and may not be corrected upon review.

## 2023-12-22 NOTE — Op Note (Signed)
 [facility] Patient Name: Jasmine Weeks Procedure Date: 12/22/2023 8:05 AM MRN: [id number] Date of Birth: 1976/04/13 Attending MD: Gemma Kelp , MD, [id number] CSN: [id number] Age: 48 Admit Type: Outpatient Procedure:                Colonoscopy Indications:              Screening for colorectal malignant neoplasm Providers:                Gemma Kelp, MD, Alisa App, Tammy                            Vaught, RN, Theola Fitch Referring MD:              Medicines:                Propofol per Anesthesia Complications:            No immediate complications. Estimated Blood Loss:     Estimated blood loss: none. Procedure:                Pre-Anesthesia Assessment:                           - Prior to the procedure, a History and Physical                            was performed, and patient medications and                            allergies were reviewed. The patient's tolerance of                            previous anesthesia was also reviewed. The risks                            and benefits of the procedure and the sedation                            options and risks were discussed with the patient.                            All questions were answered, and informed consent                            was obtained. Prior Anticoagulants: The patient has                            taken no anticoagulant or antiplatelet agents. ASA                            Grade Assessment: III - A patient with severe                            systemic disease. After reviewing the risks and  benefits, the patient was deemed in satisfactory                            condition to undergo the procedure.                           After obtaining informed consent, the colonoscope                            was passed under direct vision. Throughout the                            procedure, the patient's blood pressure, pulse, and                             oxygen saturations were monitored continuously. The                            [id number]) scope was introduced through the                            anus and advanced to the the colocolonic                            anastomosis. The colonoscopy was performed without                            difficulty. The patient tolerated the procedure                            well. The quality of the bowel preparation was                            adequate. The ileocecal valve, appendiceal orifice,                            and rectum were photographed. Scope In: 8:28:58 AM Scope Out: 8:41:22 AM Scope Withdrawal Time: 0 hours 7 minutes 7 seconds  Total Procedure Duration: 0 hours 12 minutes 24 seconds  Findings:      The perianal and digital rectal examinations were normal.      Non-bleeding internal hemorrhoids were found during retroflexion. The       hemorrhoids were mild and small.      A single medium-mouthed diverticulum was found in the sigmoid colon. .      The exam was otherwise without abnormality on direct and retroflexion       views. Impression:               - Non-bleeding internal hemorrhoids.                           - Diverticulosis in the sigmoid colon.                           - The examination was otherwise normal on direct  and retroflexion views.                           - No specimens collected. Moderate Sedation:      Moderate (conscious) sedation was personally administered by an       anesthesia professional. The following parameters were monitored: oxygen       saturation, heart rate, blood pressure, and response to care. Recommendation:           - Patient has a contact number available for                            emergencies. The signs and symptoms of potential                            delayed complications were discussed with the                            patient. Return to normal activities tomorrow.                             Written discharge instructions were provided to the                            patient.                           - Advance diet as tolerated.                           - Continue present medications.                           - Repeat colonoscopy in 10 years for screening                            purposes.                           - Return to GI office PRN. Procedure Code(s):        --- Professional ---                           [phone], Colonoscopy, flexible; diagnostic, including                            collection of specimen(s) by brushing or washing,                            when performed (separate procedure) Diagnosis Code(s):        --- Professional ---                           Z12.11, Encounter for screening for malignant                            neoplasm of colon  K64.8, Other hemorrhoids                           K57.30, Diverticulosis of large intestine without                            perforation or abscess without bleeding CPT copyright 2022 American Medical Association. All rights reserved. The codes documented in this report are preliminary and upon coder review may  be revised to meet current compliance requirements. Windsor Hatcher. Tiah Heckel, MD Gemma Kelp, MD 12/22/2023 9:26:27 AM This report has been signed electronically. Number of Addenda: 0

## 2023-12-22 NOTE — Anesthesia Preprocedure Evaluation (Addendum)
 Anesthesia Evaluation  Patient identified by MRN, date of birth, ID band Patient awake    Reviewed: Allergy & Precautions, H&P , NPO status , Patient's Chart, lab work & pertinent test results  Airway Mallampati: II  TM Distance: >3 FB Neck ROM: Full    Dental no notable dental hx.    Pulmonary former smoker   Pulmonary exam normal breath sounds clear to auscultation       Cardiovascular hypertension, Normal cardiovascular exam Rhythm:Regular Rate:Normal     Neuro/Psych negative neurological ROS  negative psych ROS   GI/Hepatic negative GI ROS, Neg liver ROS,,,  Endo/Other  negative endocrine ROS    Renal/GU negative Renal ROS  negative genitourinary   Musculoskeletal negative musculoskeletal ROS (+)    Abdominal   Peds negative pediatric ROS (+)  Hematology negative hematology ROS (+)   Anesthesia Other Findings   Reproductive/Obstetrics negative OB ROS                             Anesthesia Physical Anesthesia Plan  ASA: 2  Anesthesia Plan: General   Post-op Pain Management:    Induction: Intravenous  PONV Risk Score and Plan:   Airway Management Planned:   Additional Equipment:   Intra-op Plan:   Post-operative Plan: Extubation in OR  Informed Consent: I have reviewed the patients History and Physical, chart, labs and discussed the procedure including the risks, benefits and alternatives for the proposed anesthesia with the patient or authorized representative who has indicated his/her understanding and acceptance.     Dental advisory given  Plan Discussed with: CRNA  Anesthesia Plan Comments:        Anesthesia Quick Evaluation

## 2023-12-22 NOTE — Discharge Instructions (Addendum)
  Colonoscopy Discharge Instructions  Read the instructions outlined below and refer to this sheet in the next few weeks. These discharge instructions provide you with general information on caring for yourself after you leave the hospital. Your doctor may also give you specific instructions. While your treatment has been planned according to the most current medical practices available, unavoidable complications occasionally occur. If you have any problems or questions after discharge, call Dr. Riley Cheadle at [phone]. ACTIVITY You may resume your regular activity, but move at a slower pace for the next 24 hours.  Take frequent rest periods for the next 24 hours.  Walking will help get rid of the air and reduce the bloated feeling in your belly (abdomen).  No driving for 24 hours (because of the medicine (anesthesia) used during the test).   Do not sign any important legal documents or operate any machinery for 24 hours (because of the anesthesia used during the test).  NUTRITION Drink plenty of fluids.  You may resume your normal diet as instructed by your doctor.  Begin with a light meal and progress to your normal diet. Heavy or fried foods are harder to digest and may make you feel sick to your stomach (nauseated).  Avoid alcoholic beverages for 24 hours or as instructed.  MEDICATIONS You may resume your normal medications unless your doctor tells you otherwise.  WHAT YOU CAN EXPECT TODAY Some feelings of bloating in the abdomen.  Passage of more gas than usual.  Spotting of blood in your stool or on the toilet paper.  IF YOU HAD POLYPS REMOVED DURING THE COLONOSCOPY: No aspirin products for 7 days or as instructed.  No alcohol for 7 days or as instructed.  Eat a soft diet for the next 24 hours.  FINDING OUT THE RESULTS OF YOUR TEST Not all test results are available during your visit. If your test results are not back during the visit, make an appointment with your caregiver to find out the  results. Do not assume everything is normal if you have not heard from your caregiver or the medical facility. It is important for you to follow up on all of your test results.  SEEK IMMEDIATE MEDICAL ATTENTION IF: You have more than a spotting of blood in your stool.  Your belly is swollen (abdominal distention).  You are nauseated or vomiting.  You have a temperature over 101.  You have abdominal pain or discomfort that is severe or gets worse throughout the day.      No polyps found today  You have minimal diverticulosis  Diverticulosis information provided  It is recommended you return in 10 years for repeat screening colonoscopy

## 2023-12-22 NOTE — Anesthesia Postprocedure Evaluation (Signed)
 Anesthesia Post Note  Patient: Jasmine Weeks  Procedure(s) Performed: COLONOSCOPY  Patient location during evaluation: PACU Anesthesia Type: General Level of consciousness: awake and alert Pain management: pain level controlled Vital Signs Assessment: post-procedure vital signs reviewed and stable Respiratory status: spontaneous breathing, nonlabored ventilation, respiratory function stable and patient connected to nasal cannula oxygen Cardiovascular status: blood pressure returned to baseline and stable Postop Assessment: no apparent nausea or vomiting Anesthetic complications: no  No notable events documented.   Last Vitals:  Vitals:   12/22/23 0744 12/22/23 0844  BP: 134/86 (!) 99/55  Pulse: 90 83  Resp: (!) 22 15  Temp: 36.7 C 36.8 C  SpO2: 100% 97%    Last Pain:  Vitals:   12/22/23 0844  TempSrc: Oral  PainSc: 0-No pain                 Beacher Limerick

## 2023-12-22 NOTE — Transfer of Care (Signed)
 Immediate Anesthesia Transfer of Care Note  Patient: Jasmine Weeks  Procedure(s) Performed: COLONOSCOPY  Patient Location: Endoscopy Unit  Anesthesia Type:General  Level of Consciousness: drowsy  Airway & Oxygen Therapy: Patient Spontanous Breathing  Post-op Assessment: Report given to RN and Post -op Vital signs reviewed and stable  Post vital signs: Reviewed and stable  Last Vitals:  Vitals Value Taken Time  BP 100/60   Temp 98   Pulse 75   Resp 16   SpO2 100     Last Pain:  Vitals:   12/22/23 0823  TempSrc:   PainSc: 0-No pain         Complications: No notable events documented.

## 2023-12-26 ENCOUNTER — Encounter (HOSPITAL_COMMUNITY): Payer: Self-pay | Admitting: Internal Medicine

## 2024-08-15 ENCOUNTER — Ambulatory Visit: Admitting: Adult Health

## 2024-08-18 ENCOUNTER — Other Ambulatory Visit: Payer: Self-pay | Admitting: Adult Health

## 2024-10-29 ENCOUNTER — Ambulatory Visit: Admitting: Adult Health
